# Patient Record
Sex: Female | Born: 1970
Health system: Southern US, Community
[De-identification: ages and names within clinical notes are randomized; demographics above are authoritative.]

## PROBLEM LIST (undated history)

## (undated) DIAGNOSIS — R51 Headache: Secondary | ICD-10-CM

## (undated) DIAGNOSIS — N924 Excessive bleeding in the premenopausal period: Secondary | ICD-10-CM

## (undated) DIAGNOSIS — Z8619 Personal history of other infectious and parasitic diseases: Secondary | ICD-10-CM

## (undated) DIAGNOSIS — D649 Anemia, unspecified: Secondary | ICD-10-CM

## (undated) DIAGNOSIS — R519 Headache, unspecified: Secondary | ICD-10-CM

## (undated) HISTORY — DX: Headache, unspecified: R51.9

## (undated) HISTORY — PX: NO PAST SURGERIES: SHX2092

## (undated) HISTORY — DX: Personal history of other infectious and parasitic diseases: Z86.19

## (undated) HISTORY — DX: Headache: R51

---

## 2004-12-07 ENCOUNTER — Other Ambulatory Visit: Admission: RE | Admit: 2004-12-07 | Discharge: 2004-12-07 | Payer: Self-pay | Admitting: Obstetrics and Gynecology

## 2006-02-19 ENCOUNTER — Other Ambulatory Visit: Admission: RE | Admit: 2006-02-19 | Discharge: 2006-02-19 | Payer: Self-pay | Admitting: Obstetrics and Gynecology

## 2008-11-07 ENCOUNTER — Emergency Department (HOSPITAL_BASED_OUTPATIENT_CLINIC_OR_DEPARTMENT_OTHER): Admission: EM | Admit: 2008-11-07 | Discharge: 2008-11-07 | Payer: Self-pay | Admitting: Emergency Medicine

## 2008-11-07 ENCOUNTER — Ambulatory Visit: Payer: Self-pay | Admitting: Diagnostic Radiology

## 2010-07-26 ENCOUNTER — Encounter
Admission: RE | Admit: 2010-07-26 | Discharge: 2010-10-24 | Payer: Self-pay | Source: Home / Self Care | Attending: Internal Medicine | Admitting: Internal Medicine

## 2011-02-19 LAB — CBC
Platelets: 259 10*3/uL (ref 150–400)
WBC: 9.5 10*3/uL (ref 4.0–10.5)

## 2011-02-19 LAB — COMPREHENSIVE METABOLIC PANEL
AST: 26 U/L (ref 0–37)
Albumin: 5 g/dL (ref 3.5–5.2)
Alkaline Phosphatase: 54 U/L (ref 39–117)
Chloride: 104 mEq/L (ref 96–112)
GFR calc Af Amer: >60 mL/min (ref >60)
Potassium: 3.9 mEq/L (ref 3.5–5.1)
Total Bilirubin: 0.6 mg/dL (ref 0.3–1.2)

## 2011-02-19 LAB — URINALYSIS, ROUTINE W REFLEX MICROSCOPIC
Glucose, UA: NEGATIVE mg/dL
Protein, ur: NEGATIVE mg/dL
Urobilinogen, UA: 0.2 mg/dL (ref 0.0–1.0)

## 2011-02-19 LAB — DIFFERENTIAL
Basophils Absolute: 0.3 10*3/uL — ABNORMAL HIGH (ref 0.0–0.1)
Basophils Relative: 3 % — ABNORMAL HIGH (ref 0–1)
Eosinophils Relative: 2 % (ref 0–5)
Monocytes Absolute: 0.8 10*3/uL (ref 0.1–1.0)

## 2012-10-20 ENCOUNTER — Ambulatory Visit: Payer: BC Managed Care – PPO | Attending: Podiatry | Admitting: Rehabilitation

## 2012-10-20 DIAGNOSIS — M25579 Pain in unspecified ankle and joints of unspecified foot: Secondary | ICD-10-CM | POA: Insufficient documentation

## 2012-10-20 DIAGNOSIS — M25676 Stiffness of unspecified foot, not elsewhere classified: Secondary | ICD-10-CM | POA: Insufficient documentation

## 2012-10-20 DIAGNOSIS — IMO0001 Reserved for inherently not codable concepts without codable children: Secondary | ICD-10-CM | POA: Insufficient documentation

## 2012-10-20 DIAGNOSIS — M25673 Stiffness of unspecified ankle, not elsewhere classified: Secondary | ICD-10-CM | POA: Insufficient documentation

## 2012-10-24 ENCOUNTER — Ambulatory Visit: Payer: BC Managed Care – PPO | Admitting: Rehabilitation

## 2012-10-27 ENCOUNTER — Ambulatory Visit: Payer: BC Managed Care – PPO | Admitting: Rehabilitation

## 2012-11-06 ENCOUNTER — Ambulatory Visit: Payer: BC Managed Care – PPO | Attending: Adult Reconstructive Orthopaedic Surgery | Admitting: Rehabilitation

## 2012-11-06 DIAGNOSIS — IMO0001 Reserved for inherently not codable concepts without codable children: Secondary | ICD-10-CM | POA: Insufficient documentation

## 2012-11-06 DIAGNOSIS — M25673 Stiffness of unspecified ankle, not elsewhere classified: Secondary | ICD-10-CM | POA: Insufficient documentation

## 2012-11-06 DIAGNOSIS — M25579 Pain in unspecified ankle and joints of unspecified foot: Secondary | ICD-10-CM | POA: Insufficient documentation

## 2012-11-06 DIAGNOSIS — M25676 Stiffness of unspecified foot, not elsewhere classified: Secondary | ICD-10-CM | POA: Insufficient documentation

## 2012-11-10 ENCOUNTER — Ambulatory Visit: Payer: BC Managed Care – PPO | Admitting: Rehabilitation

## 2012-11-13 ENCOUNTER — Ambulatory Visit: Payer: BC Managed Care – PPO | Admitting: Rehabilitation

## 2012-11-18 ENCOUNTER — Ambulatory Visit: Payer: BC Managed Care – PPO | Admitting: Rehabilitation

## 2012-11-21 ENCOUNTER — Ambulatory Visit: Payer: BC Managed Care – PPO | Admitting: Rehabilitation

## 2012-11-25 ENCOUNTER — Ambulatory Visit: Payer: BC Managed Care – PPO | Admitting: Rehabilitation

## 2012-12-01 ENCOUNTER — Ambulatory Visit: Payer: BC Managed Care – PPO | Admitting: Rehabilitation

## 2012-12-09 ENCOUNTER — Ambulatory Visit: Payer: BC Managed Care – PPO | Attending: Adult Reconstructive Orthopaedic Surgery | Admitting: Rehabilitation

## 2012-12-09 DIAGNOSIS — M25673 Stiffness of unspecified ankle, not elsewhere classified: Secondary | ICD-10-CM | POA: Insufficient documentation

## 2012-12-09 DIAGNOSIS — M25579 Pain in unspecified ankle and joints of unspecified foot: Secondary | ICD-10-CM | POA: Insufficient documentation

## 2012-12-09 DIAGNOSIS — M25676 Stiffness of unspecified foot, not elsewhere classified: Secondary | ICD-10-CM | POA: Insufficient documentation

## 2012-12-09 DIAGNOSIS — IMO0001 Reserved for inherently not codable concepts without codable children: Secondary | ICD-10-CM | POA: Insufficient documentation

## 2012-12-11 ENCOUNTER — Ambulatory Visit: Payer: BC Managed Care – PPO | Admitting: Rehabilitation

## 2012-12-16 ENCOUNTER — Ambulatory Visit: Payer: BC Managed Care – PPO | Admitting: Rehabilitation

## 2012-12-18 ENCOUNTER — Ambulatory Visit: Payer: BC Managed Care – PPO | Admitting: Rehabilitation

## 2013-01-08 ENCOUNTER — Encounter: Payer: BC Managed Care – PPO | Admitting: Rehabilitation

## 2013-05-25 ENCOUNTER — Encounter (HOSPITAL_BASED_OUTPATIENT_CLINIC_OR_DEPARTMENT_OTHER): Payer: Self-pay

## 2013-05-25 ENCOUNTER — Emergency Department (HOSPITAL_BASED_OUTPATIENT_CLINIC_OR_DEPARTMENT_OTHER)
Admission: EM | Admit: 2013-05-25 | Discharge: 2013-05-25 | Disposition: A | Discharge status: Home / Self Care | Payer: BC Managed Care – PPO | Attending: Emergency Medicine | Admitting: Emergency Medicine

## 2013-05-25 DIAGNOSIS — H109 Unspecified conjunctivitis: Secondary | ICD-10-CM | POA: Insufficient documentation

## 2013-05-25 MED ORDER — TOBRAMYCIN 0.3 % OP SOLN: 1.0000 [drp] | OPHTHALMIC | Status: AC

## 2013-05-25 MED ORDER — FLUORESCEIN SODIUM 1 MG OP STRP
1.0000 | ORAL_STRIP | Freq: Once | OPHTHALMIC | Status: AC
  Administered 2013-05-25: 1 via OPHTHALMIC
  Filled 2013-05-25: qty 1

## 2013-05-25 MED ORDER — TETRACAINE HCL 0.5 % OP SOLN
2.0000 [drp] | Freq: Once | OPHTHALMIC | Status: AC
  Administered 2013-05-25: 2 [drp] via OPHTHALMIC
  Filled 2013-05-25: qty 2

## 2013-05-25 NOTE — ED Notes (Signed)
Redness and drainage to left eye.

## 2013-05-25 NOTE — ED Provider Notes (Signed)
History    CSN: [id number] Arrival date & time 05/25/13  1308  First MD Initiated Contact with Patient 05/25/13 [phone]     Chief Complaint  Patient presents with  . Conjunctivitis   (Consider location/radiation/quality/duration/timing/severity/associated sxs/prior Treatment) HPI Comments: Patient presents with left eye redness and drainage since last night. She started having itching to her eye 2 days ago. This morning there is redness to her eye with clear drainage and matted eyelids. Denies any fevers, chills, nausea or vomiting. Denies any visual change. Denies any headache. Denies anyone else with similar symptoms. No previous eye issues. Does not use contacts or glasses.  Patient is a 42 y.o. female presenting with conjunctivitis. The history is provided by the patient.  Conjunctivitis This is a new problem. The current episode started 2 days ago. The problem occurs constantly. The problem has been gradually worsening. Pertinent negatives include no chest pain, no abdominal pain, no headaches and no shortness of breath. Nothing aggravates the symptoms. Nothing relieves the symptoms. She has tried nothing for the symptoms.   History reviewed. No pertinent past medical history. History reviewed. No pertinent past surgical history. No family history on file. History  Substance Use Topics  . Smoking status: Never Smoker   . Smokeless tobacco: Not on file  . Alcohol Use: No   OB History   Grav Para Term Preterm Abortions TAB SAB Ect Mult Living                 Review of Systems  Constitutional: Negative for activity change.  HENT: Negative for congestion and rhinorrhea.   Eyes: Positive for discharge, redness and itching. Negative for photophobia, pain and visual disturbance.  Respiratory: Negative for cough, chest tightness and shortness of breath.   Cardiovascular: Negative for chest pain.  Gastrointestinal: Negative for nausea, vomiting and abdominal pain.  Genitourinary:  Negative for dysuria, vaginal bleeding and vaginal discharge.  Musculoskeletal: Negative for back pain.  Skin: Negative for rash.  Neurological: Negative for dizziness, weakness and headaches.  A complete 10 system review of systems was obtained and all systems are negative except as noted in the HPI and PMH.    Allergies  Review of patient's allergies indicates no known allergies.  Home Medications   Current Outpatient Rx  Name  Route  Sig  Dispense  Refill  . tobramycin (TOBREX) 0.3 % ophthalmic solution   Left Eye   Place 1 drop into the left eye every 4 (four) hours.   5 mL   0    BP 120/56  Pulse 75  Temp(Src) 98.2 F (36.8 C) (Oral)  Resp 20  Ht 5\' 8"  (1.727 m)  Wt 169 lb (76.658 kg)  BMI 25.7 kg/m2  SpO2 99% Physical Exam  Constitutional: She is oriented to person, place, and time. She appears well-developed and well-nourished. No distress.  HENT:  Head: Normocephalic and atraumatic.  Mouth/Throat: Oropharynx is clear and moist. No oropharyngeal exudate.  Eyes: EOM are normal. Pupils are equal, round, and reactive to light. Left eye exhibits discharge. No foreign body present in the left eye. Left conjunctiva is injected.  Slit lamp exam:      The left eye shows no corneal abrasion and no fluorescein uptake.  Conjunctival injection.  No pain with EOMI, PERRLA. No fluoroscein uptake.  IOP 14  Neck: Normal range of motion. Neck supple.  Cardiovascular: Normal rate, regular rhythm and normal heart sounds.   No murmur heard. Pulmonary/Chest: Effort normal and breath sounds normal.  No respiratory distress.  Abdominal: Soft. There is no tenderness. There is no rebound and no guarding.  Musculoskeletal: Normal range of motion. She exhibits no edema and no tenderness.  Neurological: She is alert and oriented to person, place, and time. No cranial nerve deficit. She exhibits normal muscle tone. Coordination normal.  Skin: Skin is warm.    ED Course  Procedures  (including critical care time) Labs Reviewed - No data to display No results found. 1. Conjunctivitis     MDM  L eye redness and drainage.  No fever or visual change.  No photophobia. No pain with EOMI.  No abrasion seen. IOP normal.  Will treat for conjunctivitis.   Glynn Octave, MD 05/25/13 [phone]

## 2015-03-19 ENCOUNTER — Encounter (HOSPITAL_BASED_OUTPATIENT_CLINIC_OR_DEPARTMENT_OTHER): Payer: Self-pay

## 2015-03-19 ENCOUNTER — Emergency Department (HOSPITAL_BASED_OUTPATIENT_CLINIC_OR_DEPARTMENT_OTHER): Payer: Managed Care, Other (non HMO)

## 2015-03-19 ENCOUNTER — Emergency Department (HOSPITAL_BASED_OUTPATIENT_CLINIC_OR_DEPARTMENT_OTHER)
Admission: EM | Admit: 2015-03-19 | Discharge: 2015-03-19 | Disposition: A | Discharge status: Home / Self Care | Payer: Managed Care, Other (non HMO) | Attending: Emergency Medicine | Admitting: Emergency Medicine

## 2015-03-19 DIAGNOSIS — Y998 Other external cause status: Secondary | ICD-10-CM | POA: Insufficient documentation

## 2015-03-19 DIAGNOSIS — S80811A Abrasion, right lower leg, initial encounter: Secondary | ICD-10-CM | POA: Insufficient documentation

## 2015-03-19 DIAGNOSIS — Y9389 Activity, other specified: Secondary | ICD-10-CM | POA: Insufficient documentation

## 2015-03-19 DIAGNOSIS — S86911A Strain of unspecified muscle(s) and tendon(s) at lower leg level, right leg, initial encounter: Secondary | ICD-10-CM | POA: Diagnosis not present

## 2015-03-19 DIAGNOSIS — W1842XA Slipping, tripping and stumbling without falling due to stepping into hole or opening, initial encounter: Secondary | ICD-10-CM | POA: Insufficient documentation

## 2015-03-19 DIAGNOSIS — Y9289 Other specified places as the place of occurrence of the external cause: Secondary | ICD-10-CM | POA: Insufficient documentation

## 2015-03-19 DIAGNOSIS — S8391XA Sprain of unspecified site of right knee, initial encounter: Secondary | ICD-10-CM | POA: Insufficient documentation

## 2015-03-19 DIAGNOSIS — S8991XA Unspecified injury of right lower leg, initial encounter: Secondary | ICD-10-CM | POA: Diagnosis present

## 2015-03-19 NOTE — ED Notes (Signed)
Patient here with right knee pain this evening after stepping in hole. Abrasion noted to right lower leg as well.

## 2015-03-19 NOTE — Discharge Instructions (Signed)
Use crutches and ace wrap for comfort.   You may bear weight on the knee.   Follow up with your doctor.   Take tylenol, motrin for pain.   Return to ER if you have severe pain, unable to walk.

## 2015-03-19 NOTE — ED Provider Notes (Signed)
CSN: [id number]     Arrival date & time 03/19/15  1837 History  This chart was scribed for Wandra Arthurs, MD by Chester Holstein, ED Scribe. This patient was seen in room MH10/MH10 and the patient's care was started at 7:46 PM.    Chief Complaint  Patient presents with  . Knee Injury     The history is provided by the patient. No language interpreter was used.   HPI Comments: Ann Owen is a 44 y.o. female who presents to the Emergency Department complaining of right knee injury. Pt states she stepped into the hole and twisted to her left. Pt was working for [employer] and stepped into an 18 in hole at onset.  Pt was able to ambulate and continue to build the deck after incident. Noted swelling to knee and abrasions to shin.  Pt denies weakness, numbness, head injury and LOC.   History reviewed. No pertinent past medical history. History reviewed. No pertinent past surgical history. No family history on file. History  Substance Use Topics  . Smoking status: Never Smoker   . Smokeless tobacco: Not on file  . Alcohol Use: No   OB History    No data available     Review of Systems  Musculoskeletal: Positive for myalgias, joint swelling and arthralgias. Negative for gait problem.  Skin: Positive for wound.  Neurological: Negative for syncope, weakness, numbness and headaches.  All other systems reviewed and are negative.     Allergies  Review of patient's allergies indicates no known allergies.  Home Medications   Prior to Admission medications   Not on File   BP 118/64 mmHg  Pulse 72  Temp(Src) 98.4 F (36.9 C) (Oral)  Resp 18  Ht 5\' 8"  (1.727 m)  Wt 174 lb (78.926 kg)  BMI 26.46 kg/m2  SpO2 100%  LMP 03/19/2015 Physical Exam  Constitutional: She is oriented to person, place, and time. She appears well-developed and well-nourished.  HENT:  Head: Normocephalic.  Eyes: Conjunctivae are normal.  Neck: Normal range of motion. Neck supple.  Pulmonary/Chest:  Effort normal.  Musculoskeletal: Normal range of motion.  Swelling around right knee Mild pain on the patella; patella appears stable Abrasion on right shin with no tenderness  Neurological: She is alert and oriented to person, place, and time.  Skin: Skin is warm and dry.  Psychiatric: She has a normal mood and affect. Her behavior is normal.  Nursing note and vitals reviewed.   ED Course  Procedures (including critical care time) DIAGNOSTIC STUDIES: Oxygen Saturation is 100% on room air, normal by my interpretation.    COORDINATION OF CARE: 7:50 PM Discussed treatment plan with patient at beside, the patient agrees with the plan and has no further questions at this time.   Labs Review Labs Reviewed - No data to display  Imaging Review Dg Knee Complete 4 Views Right  03/19/2015   CLINICAL DATA:  Acute onset of anterior right knee pain after stepping in hole, with lateral pain on movement of the foot. Initial encounter.  EXAM: RIGHT KNEE - COMPLETE 4+ VIEW  COMPARISON:  None.  FINDINGS: There is no evidence of fracture or dislocation. The joint spaces are preserved. No significant degenerative change is seen; the patellofemoral joint is grossly unremarkable in appearance.  No significant joint effusion is seen. The visualized soft tissues are normal in appearance.  IMPRESSION: No evidence of fracture or dislocation.   Electronically Signed   By: Francoise Schaumann.D.  On: 03/19/2015 19:22     EKG Interpretation None      MDM   Final diagnoses:  None   Ann Owen is a 44 y.o. female here with R knee injury. Xray showed no fracture. Neurovascular intact. Likely sprain. Ace wrap applied, given crutches. Will dc home.    I personally performed the services described in this documentation, which was scribed in my presence. The recorded information has been reviewed and is accurate.    Wandra Arthurs, MD 03/19/15 [phone]

## 2016-01-03 MED FILL — AMOXICILLIN 500 MG CAPSULE: 500 | 6 days supply | Qty: 28 | Fill #0

## 2016-01-09 MED FILL — AMOXICILLIN 500 MG CAPSULE: 500 | 6 days supply | Qty: 28 | Fill #1

## 2016-06-29 ENCOUNTER — Telehealth: Payer: Self-pay | Admitting: *Deleted

## 2016-06-29 NOTE — Telephone Encounter (Signed)
Patient returning your call.

## 2016-06-29 NOTE — Telephone Encounter (Signed)
Unable to reach patient at time of Pre-Visit Call.  Left message for patient to return call when available.    

## 2016-07-02 ENCOUNTER — Encounter: Payer: Self-pay | Admitting: Family Medicine

## 2016-07-02 ENCOUNTER — Ambulatory Visit (INDEPENDENT_AMBULATORY_CARE_PROVIDER_SITE_OTHER): Payer: 59 | Admitting: Family Medicine

## 2016-07-02 VITALS — BP 100/62 | HR 69 | Temp 98.4°F | Ht 67.5 in | Wt 177.6 lb

## 2016-07-02 DIAGNOSIS — Z114 Encounter for screening for human immunodeficiency virus [HIV]: Secondary | ICD-10-CM

## 2016-07-02 DIAGNOSIS — Z13220 Encounter for screening for lipoid disorders: Secondary | ICD-10-CM | POA: Diagnosis not present

## 2016-07-02 DIAGNOSIS — G5603 Carpal tunnel syndrome, bilateral upper limbs: Secondary | ICD-10-CM | POA: Diagnosis not present

## 2016-07-02 DIAGNOSIS — M7711 Lateral epicondylitis, right elbow: Secondary | ICD-10-CM

## 2016-07-02 DIAGNOSIS — Z1239 Encounter for other screening for malignant neoplasm of breast: Secondary | ICD-10-CM

## 2016-07-02 LAB — LIPID PANEL
CHOL/HDL RATIO: 2
Cholesterol: 173 mg/dL (ref 0–200)
HDL: 76 mg/dL (ref >39.00)
LDL Cholesterol: 83 mg/dL (ref 0–99)
NONHDL: 97.07
Triglycerides: 70 mg/dL (ref 0.0–149.0)
VLDL: 14 mg/dL (ref 0.0–40.0)

## 2016-07-02 LAB — COMPLETE METABOLIC PANEL WITH GFR
ALBUMIN: 4.2 g/dL (ref 3.6–5.1)
ALK PHOS: 50 U/L (ref 33–115)
ALT: 8 U/L (ref 6–29)
AST: 11 U/L (ref 10–35)
BILIRUBIN TOTAL: 0.3 mg/dL (ref 0.2–1.2)
BUN: 10 mg/dL (ref 7–25)
CALCIUM: 9.2 mg/dL (ref 8.6–10.2)
CO2: 24 mmol/L (ref 20–31)
CREATININE: 0.79 mg/dL (ref 0.50–1.10)
Chloride: 105 mmol/L (ref 98–110)
GFR, Est African American: >89 mL/min (ref >=60)
GFR, Est Non African American: 89 mL/min (ref >=60)
GLUCOSE: 84 mg/dL (ref 65–99)
POTASSIUM: 4.5 mmol/L (ref 3.5–5.3)
SODIUM: 136 mmol/L (ref 135–146)
TOTAL PROTEIN: 6.9 g/dL (ref 6.1–8.1)

## 2016-07-02 LAB — HIV ANTIBODY (ROUTINE TESTING W REFLEX): HIV: NONREACTIVE

## 2016-07-02 NOTE — Patient Instructions (Addendum)
Tennis elbow-wrist strap; Band-It is recommended by some sports docs  Start wearing your wrist splints again. Lateral Epicondylitis With Rehab Lateral epicondylitis involves inflammation and pain around the outer portion of the elbow. The pain is caused by inflammation of the tendons in the forearm that bring back (extend) the wrist. Lateral epicondylitis is also called tennis elbow, because it is very common in tennis players. However, it may occur in any individual who extends the wrist repetitively. If lateral epicondylitis is left untreated, it may become a chronic problem. SYMPTOMS   Pain, tenderness, and inflammation on the outer (lateral) side of the elbow.  Pain or weakness with gripping activities.  Pain that increases with wrist-twisting motions (playing tennis, using a screwdriver, opening a door or a jar).  Pain with lifting objects, including a coffee cup. CAUSES  Lateral epicondylitis is caused by inflammation of the tendons that extend the wrist. Causes of injury may include:  Repetitive stress and strain on the muscles and tendons that extend the wrist.  Sudden change in activity level or intensity.  Incorrect grip in racquet sports.  Incorrect grip size of racquet (often too large).  Incorrect hitting position or technique (usually backhand, leading with the elbow).  Using a racket that is too heavy. RISK INCREASES WITH:  Sports or occupations that require repetitive and/or strenuous forearm and wrist movements (tennis, squash, racquetball, carpentry).  Poor wrist and forearm strength and flexibility.  Failure to warm up properly before activity.  Resuming activity before healing, rehabilitation, and conditioning are complete. PREVENTION   Warm up and stretch properly before activity.  Maintain physical fitness:  Strength, flexibility, and endurance.  Cardiovascular fitness.  Wear and use properly fitted equipment.  Learn and use proper technique and  have a coach correct improper technique.  Wear a tennis elbow (counterforce) brace. PROGNOSIS  The course of this condition depends on the degree of the injury. If treated properly, acute cases (symptoms lasting less than 4 weeks) are often resolved in 2 to 6 weeks. Chronic (longer lasting cases) often resolve in 3 to 6 months but may require physical therapy. RELATED COMPLICATIONS   Frequently recurring symptoms, resulting in a chronic problem. Properly treating the problem the first time decreases frequency of recurrence.  Chronic inflammation, scarring tendon degeneration, and partial tendon tear, requiring surgery.  Delayed healing or resolution of symptoms. TREATMENT  Treatment first involves the use of ice and medicine to reduce pain and inflammation. Strengthening and stretching exercises may help reduce discomfort if performed regularly. These exercises may be performed at home if the condition is an acute injury. Chronic cases may require a referral to a physical therapist for evaluation and treatment. Your caregiver may advise a corticosteroid injection to help reduce inflammation. Rarely, surgery is needed. MEDICATION  If pain medicine is needed, nonsteroidal anti-inflammatory medicines (aspirin and ibuprofen), or other minor pain relievers (acetaminophen), are often advised.  Do not take pain medicine for 7 days before surgery.  Prescription pain relievers may be given, if your caregiver thinks they are needed. Use only as directed and only as much as you need.  Corticosteroid injections may be recommended. These injections should be reserved only for the most severe cases, because they can only be given a certain number of times. HEAT AND COLD  Cold treatment (icing) should be applied for 10 to 15 minutes every 2 to 3 hours for inflammation and pain, and immediately after activity that aggravates your symptoms. Use ice packs or an ice massage.  Heat treatment may be used  before performing stretching and strengthening activities prescribed by your caregiver, physical therapist, or athletic trainer. Use a heat pack or a warm water soak. SEEK MEDICAL CARE IF: Symptoms get worse or do not improve in 2 weeks, despite treatment. EXERCISES  RANGE OF MOTION (ROM) AND STRETCHING EXERCISES - Epicondylitis, Lateral (Tennis Elbow) These exercises may help you when beginning to rehabilitate your injury. Your symptoms may go away with or without further involvement from your physician, physical therapist, or athletic trainer. While completing these exercises, remember:   Restoring tissue flexibility helps normal motion to return to the joints. This allows healthier, less painful movement and activity.  An effective stretch should be held for at least 30 seconds.  A stretch should never be painful. You should only feel a gentle lengthening or release in the stretched tissue. RANGE OF MOTION - Wrist Flexion, Active-Assisted  Extend your right / left elbow with your fingers pointing down.*  Gently pull the back of your hand towards you, until you feel a gentle stretch on the top of your forearm.  Hold this position for __________ seconds. Repeat __________ times. Complete this exercise __________ times per day.  *If directed by your physician, physical therapist or athletic trainer, complete this stretch with your elbow bent, rather than extended. RANGE OF MOTION - Wrist Extension, Active-Assisted  Extend your right / left elbow and turn your palm upwards.*  Gently pull your palm and fingertips back, so your wrist extends and your fingers point more toward the ground.  You should feel a gentle stretch on the inside of your forearm.  Hold this position for __________ seconds. Repeat __________ times. Complete this exercise __________ times per day. *If directed by your physician, physical therapist or athletic trainer, complete this stretch with your elbow bent, rather  than extended. STRETCH - Wrist Flexion  Place the back of your right / left hand on a tabletop, leaving your elbow slightly bent. Your fingers should point away from your body.  Gently press the back of your hand down onto the table by straightening your elbow. You should feel a stretch on the top of your forearm.  Hold this position for __________ seconds. Repeat __________ times. Complete this stretch __________ times per day.  STRETCH - Wrist Extension   Place your right / left fingertips on a tabletop, leaving your elbow slightly bent. Your fingers should point backwards.  Gently press your fingers and palm down onto the table by straightening your elbow. You should feel a stretch on the inside of your forearm.  Hold this position for __________ seconds. Repeat __________ times. Complete this stretch __________ times per day.  STRENGTHENING EXERCISES - Epicondylitis, Lateral (Tennis Elbow) These exercises may help you when beginning to rehabilitate your injury. They may resolve your symptoms with or without further involvement from your physician, physical therapist, or athletic trainer. While completing these exercises, remember:   Muscles can gain both the endurance and the strength needed for everyday activities through controlled exercises.  Complete these exercises as instructed by your physician, physical therapist or athletic trainer. Increase the resistance and repetitions only as guided.  You may experience muscle soreness or fatigue, but the pain or discomfort you are trying to eliminate should never worsen during these exercises. If this pain does get worse, stop and make sure you are following the directions exactly. If the pain is still present after adjustments, discontinue the exercise until you can discuss the trouble with your  caregiver. STRENGTH - Wrist Flexors  Sit with your right / left forearm palm-up and fully supported on a table or countertop. Your elbow should  be resting below the height of your shoulder. Allow your wrist to extend over the edge of the surface.  Loosely holding a __________ weight, or a piece of rubber exercise band or tubing, slowly curl your hand up toward your forearm.  Hold this position for __________ seconds. Slowly lower the wrist back to the starting position in a controlled manner. Repeat __________ times. Complete this exercise __________ times per day.  STRENGTH - Wrist Extensors  Sit with your right / left forearm palm-down and fully supported on a table or countertop. Your elbow should be resting below the height of your shoulder. Allow your wrist to extend over the edge of the surface.  Loosely holding a __________ weight, or a piece of rubber exercise band or tubing, slowly curl your hand up toward your forearm.  Hold this position for __________ seconds. Slowly lower the wrist back to the starting position in a controlled manner. Repeat __________ times. Complete this exercise __________ times per day.  STRENGTH - Ulnar Deviators  Stand with a ____________________ weight in your right / left hand, or sit while holding a rubber exercise band or tubing, with your healthy arm supported on a table or countertop.  Move your wrist, so that your pinkie travels toward your forearm and your thumb moves away from your forearm.  Hold this position for __________ seconds and then slowly lower the wrist back to the starting position. Repeat __________ times. Complete this exercise __________ times per day STRENGTH - Radial Deviators  Stand with a ____________________ weight in your right / left hand, or sit while holding a rubber exercise band or tubing, with your injured arm supported on a table or countertop.  Raise your hand upward in front of you or pull up on the rubber tubing.  Hold this position for __________ seconds and then slowly lower the wrist back to the starting position. Repeat __________ times. Complete  this exercise __________ times per day. STRENGTH - Forearm Supinators   Sit with your right / left forearm supported on a table, keeping your elbow below shoulder height. Rest your hand over the edge, palm down.  Gently grip a hammer or a soup ladle.  Without moving your elbow, slowly turn your palm and hand upward to a "thumbs-up" position.  Hold this position for __________ seconds. Slowly return to the starting position. Repeat __________ times. Complete this exercise __________ times per day.  STRENGTH - Forearm Pronators   Sit with your right / left forearm supported on a table, keeping your elbow below shoulder height. Rest your hand over the edge, palm up.  Gently grip a hammer or a soup ladle.  Without moving your elbow, slowly turn your palm and hand upward to a "thumbs-up" position.  Hold this position for __________ seconds. Slowly return to the starting position. Repeat __________ times. Complete this exercise __________ times per day.  STRENGTH - Grip  Grasp a tennis ball, a dense sponge, or a large, rolled sock in your hand.  Squeeze as hard as you can, without increasing any pain.  Hold this position for __________ seconds. Release your grip slowly. Repeat __________ times. Complete this exercise __________ times per day.  STRENGTH - Elbow Extensors, Isometric  Stand or sit upright, on a firm surface. Place your right / left arm so that your palm faces your stomach,  and it is at the height of your waist.  Place your opposite hand on the underside of your forearm. Gently push up as your right / left arm resists. Push as hard as you can with both arms, without causing any pain or movement at your right / left elbow. Hold this stationary position for __________ seconds. Gradually release the tension in both arms. Allow your muscles to relax completely before repeating.   This information is not intended to replace advice given to you by your health care provider. Make  sure you discuss any questions you have with your health care provider.   Document Released: 10/22/2005 Document Revised: 11/12/2014 Document Reviewed: 02/03/2009 [employer]vier Interactive Patient Education [employer].

## 2016-07-02 NOTE — Progress Notes (Signed)
Chief Complaint  Patient presents with  . Establish Care    pt having pain in (B) wrist and (R) elbow along with tingling-x 1 year with the wrist and x 3 mos with the elbow       New Patient Visit SUBJECTIVE: HPI: Ann Owen is an 45 y.o.female who is being seen for establishing care.  The patient was previously seen at [place], but moved to [place] and lives much closer to [place].  Health maintenance history: Pap/pelvic: 2015- HPV and pap both neg  Mammogram: 2015 Routine blood work: ~3 years HIV screening: Unsure, doesn't believe she has had done Tetanus: Unsure; will check date, but has been within 10 years  Concerns:  Wrist issue B/l wrist pain/numbness/tingling for 7-8 months, R worse than L. She has a hx of CTS that resolved with the use of wrist splints. She still has the splints, but has not been using them. Pt admits she has been doing more typing with her job. Symptoms are worse in the evening and at night.  Elbow pain R elbow started bothering her around 3 mo ago. The outside of her elbow hurts. She thinks it is just tennis elbow, but was told by a co-worker that a family member required surgery. She would like it checked out. She attributes her typing to the root cause of this. No numbness or tingling on the elbow.  No Known Allergies  Past Medical History:  Diagnosis Date  . Frequent headaches   . History of chicken pox    Past Surgical History:  Procedure Laterality Date  . NO PAST SURGERIES     Social History   Social History  . Marital status: Married   Social History Main Topics  . Smoking status: Never Smoker  . Smokeless tobacco: Never Used  . Alcohol use No  . Drug use: No   Family History  Problem Relation Age of Onset  . Hyperlipidemia Mother   . Arthritis Father   . Cancer Maternal Grandmother     Bone   Takes no medications routinely  Patient's last menstrual period was 06/18/2016.  ROS Neuro: +Numbness and tingling in hands  MSK: +R  elbow and b/l wrist pain   OBJECTIVE: BP 100/62 (BP Location: Left Arm, Patient Position: Sitting, Cuff Size: Normal)   Pulse 69   Temp 98.4 F (36.9 C) (Oral)   Ht 5' 7.5" (1.715 m)   Wt 177 lb 9.6 oz (80.6 kg)   LMP 06/18/2016   SpO2 99%   BMI 27.41 kg/m   Constitutional: -  VS reviewed -  Well developed, well nourished, appears stated age -  No apparent distress  Psychiatric: -  Oriented to person, place, and time -  Memory intact -  Affect and mood normal -  Fluent conversation, good eye contact -  Judgment and insight age appropriate  Eye: -  Conjunctivae clear, no discharge -  Pupils symmetric, round, reactive to light  Cardiovascular: -  RRR, no murmurs -  No LE edema -  Brisk cap refill of UE digits  Respiratory: -  Normal respiratory effort, no accessory muscle use, no retraction -  Breath sounds equal, no wheezes, no ronchi, no crackles  Gastrointestinal: -  Bowel sounds normal -  No tenderness, no distention, no guarding, no masses  Neurological:  -  CN II - XII grossly intact -  Neg Tinel's and Phalen's -  Sensation grossly intact to light touch, equal bilaterally  Musculoskeletal: -  Grip strength  adequate -  No thenar eminence atrophy or asymmetry -  +TTP over the lateral epicondyle on R -  +pain with resisted R wrist extension; strength is 5/5 throughout -  No swelling or ecchymosis appreciate -  No deformity  Skin: -  No significant lesion on inspection -  Warm and dry to palpation   ASSESSMENT/PLAN: Bilateral carpal tunnel syndrome  Tennis elbow syndrome, right  Screening for HIV (human immunodeficiency virus) - Plan: HIV antibody  Screening cholesterol level - Plan: COMPLETE METABOLIC PANEL WITH GFR, Lipid panel  Screening breast examination - Plan: MM DIGITAL SCREENING BILATERAL  Patient instructed to sign release of records form for her previous PCP. Start wearing splints again. Made recommendation for tennis elbow strap. Motrin/Tylenol prn.  Ice prn. Stretches/exercises given for tennis elbow rehab. Avoid activities that aggravate symptoms when/if able. If symptoms worse, will recommend PT for elbow. Will consider Hand referral vs EMG. Patient should return prn or pending an abnormal lab result. The patient voiced understanding and agreement to the plan.   Crosby Oyster Despard

## 2016-07-02 NOTE — Progress Notes (Signed)
Pre visit review using our clinic review tool, if applicable. No additional management support is needed unless otherwise documented below in the visit note. 

## 2016-07-04 ENCOUNTER — Encounter: Payer: Self-pay | Admitting: *Deleted

## 2016-08-27 ENCOUNTER — Ambulatory Visit (HOSPITAL_BASED_OUTPATIENT_CLINIC_OR_DEPARTMENT_OTHER)
Admission: RE | Admit: 2016-08-27 | Discharge: 2016-08-27 | Disposition: A | Discharge status: Home / Self Care | Payer: 59 | Source: Ambulatory Visit | Attending: Family Medicine | Admitting: Family Medicine

## 2016-08-27 DIAGNOSIS — Z1239 Encounter for other screening for malignant neoplasm of breast: Secondary | ICD-10-CM

## 2016-08-27 DIAGNOSIS — Z1231 Encounter for screening mammogram for malignant neoplasm of breast: Secondary | ICD-10-CM | POA: Diagnosis not present

## 2016-10-09 ENCOUNTER — Ambulatory Visit (INDEPENDENT_AMBULATORY_CARE_PROVIDER_SITE_OTHER): Payer: 59 | Admitting: Podiatry

## 2016-10-09 ENCOUNTER — Encounter: Payer: Self-pay | Admitting: Podiatry

## 2016-10-09 VITALS — BP 110/57 | HR 75 | Ht 67.5 in | Wt 174.0 lb

## 2016-10-09 DIAGNOSIS — M216X9 Other acquired deformities of unspecified foot: Secondary | ICD-10-CM

## 2016-10-09 DIAGNOSIS — M7740 Metatarsalgia, unspecified foot: Secondary | ICD-10-CM

## 2016-10-09 DIAGNOSIS — M21969 Unspecified acquired deformity of unspecified lower leg: Secondary | ICD-10-CM | POA: Diagnosis not present

## 2016-10-09 NOTE — Progress Notes (Signed)
SUBJECTIVE: 45 y.o. year old female presents complaining of back of heel pain that radiates to bottom of the heel and pain at the 5th toe area of right foot. Left foot hurts under the 3rd and 4th digits that radiates to the tip of toes. Duration of 4 months. Had done stretch exercise for Achilles area and heating pads that did not help. On feet about 11 hours a day 7 off and 7 on.  REVIEW OF SYSTEMS: Pertinent items noted in HPI and remainder of comprehensive ROS otherwise negative.  OBJECTIVE: DERMATOLOGIC EXAMINATION: No abnormal findings.  VASCULAR EXAMINATION OF LOWER LIMBS: All pedal pulses are palpable with normal pulsation.  Capillary Filling times within 3 seconds in all digits.  No edema or erythema noted. Temperature gradient from tibial crest to dorsum of foot is within normal bilateral.  NEUROLOGIC EXAMINATION OF THE LOWER LIMBS: All epicritic and tactile sensations grossly intact.   MUSCULOSKELETAL EXAMINATION: Positive for tight Achilles tendon bilateral. Excess STJ pronation with load of foot bilateral. Elevated first metatarsal bilateral. Bunion deformity left. Pain at right heel, lesser MPJ bilateral, and digits, 3rd and 4th right.  Previous x-rays done in 2014 revealed abnormal elevated first metatarsal bones bilateral.  ASSESSMENT: Equinus bilateral ankle. Plantar fasciitis right. Metatarsalgia bilateral. Rule out Morton's neuroma right. Elevated first metatarsal bilateral. Excess STJ pronation bilateral. Bunion left.  PLAN: Reviewed clinical findings and available treatment options. Both feet casted for Orthotics. Metatarsal binder dispensed medium 1 pr.

## 2016-10-09 NOTE — Patient Instructions (Signed)
Seen for pain in right heel and lesser digits bilateral. Noted of weak first metatarsal bone with lateral weigh shifting. Mild tightness of Achilles tendon present. Both feet casted for orthotics. Metatarsal binder dispensed x 1 pr medium. Will call when they are ready.

## 2016-12-18 ENCOUNTER — Ambulatory Visit (INDEPENDENT_AMBULATORY_CARE_PROVIDER_SITE_OTHER): Payer: 59 | Admitting: Podiatry

## 2016-12-18 DIAGNOSIS — M7740 Metatarsalgia, unspecified foot: Secondary | ICD-10-CM

## 2016-12-18 NOTE — Patient Instructions (Addendum)
Orthotics are doing well. No new complaints.

## 2016-12-18 NOTE — Progress Notes (Signed)
Orthotic check up. They makes her feet feel better.  At the end of the day gets tingling on bottom of the foot but not as bad as before.  Doing well with orthotics. Continue with current level of care.             .                                                                                                                                                                                                                                                       .                           +6.++No flowsheet data found.  .+++++++++++++++++++++++++++++++++++++++++++++++++++++++++++++++++++++++++++.......................................................+++++++++++++++++++++++++++++++++++++++++++++++++++++++++++++++++++++++++++++++++++++++++++++++++++++++++++++++++++++++++++++++++++++++++++++++++++++++++++++++++++++++++++++++++++++++++++++++++++++++++++++++++++++++++++++++++++++++++++++++++++++++++++++++++                                                                                                                                                                                                                                                                                        +  6                      

## 2016-12-19 ENCOUNTER — Encounter: Payer: Self-pay | Admitting: Podiatry

## 2017-01-17 ENCOUNTER — Telehealth: Payer: 59 | Admitting: Family

## 2017-01-17 DIAGNOSIS — B9689 Other specified bacterial agents as the cause of diseases classified elsewhere: Secondary | ICD-10-CM

## 2017-01-17 DIAGNOSIS — J028 Acute pharyngitis due to other specified organisms: Secondary | ICD-10-CM | POA: Diagnosis not present

## 2017-01-17 MED ORDER — AZITHROMYCIN 250 MG PO TABS: ORAL_TABLET | ORAL | 0 refills | Status: DC

## 2017-01-17 MED ORDER — BENZONATATE 100 MG PO CAPS: 100.0000 mg | ORAL_CAPSULE | Freq: Three times a day (TID) | ORAL | 0 refills | Status: DC | PRN

## 2017-01-17 MED FILL — BENZONATATE 100 MG CAP: 100 | 5 days supply | Qty: 30 | Fill #0

## 2017-01-17 MED FILL — AZITHROMYCIN 250 MG TABLET: 250 | 5 days supply | Qty: 6 | Fill #0

## 2017-01-17 NOTE — Progress Notes (Signed)

## 2018-03-18 DIAGNOSIS — Z0100 Encounter for examination of eyes and vision without abnormal findings: Secondary | ICD-10-CM | POA: Diagnosis not present

## 2018-07-13 ENCOUNTER — Emergency Department (INDEPENDENT_AMBULATORY_CARE_PROVIDER_SITE_OTHER): Payer: 59

## 2018-07-13 ENCOUNTER — Emergency Department (INDEPENDENT_AMBULATORY_CARE_PROVIDER_SITE_OTHER)
Admission: EM | Admit: 2018-07-13 | Discharge: 2018-07-13 | Disposition: A | Discharge status: Home / Self Care | Payer: 59 | Source: Home / Self Care | Attending: Family Medicine | Admitting: Family Medicine

## 2018-07-13 ENCOUNTER — Encounter: Payer: Self-pay | Admitting: Emergency Medicine

## 2018-07-13 ENCOUNTER — Other Ambulatory Visit: Payer: Self-pay

## 2018-07-13 DIAGNOSIS — M7989 Other specified soft tissue disorders: Secondary | ICD-10-CM | POA: Diagnosis not present

## 2018-07-13 DIAGNOSIS — S93402A Sprain of unspecified ligament of left ankle, initial encounter: Secondary | ICD-10-CM | POA: Diagnosis not present

## 2018-07-13 DIAGNOSIS — M25572 Pain in left ankle and joints of left foot: Secondary | ICD-10-CM | POA: Diagnosis not present

## 2018-07-13 DIAGNOSIS — M79672 Pain in left foot: Secondary | ICD-10-CM

## 2018-07-13 NOTE — ED Triage Notes (Signed)
Pt c/o of left ankle. States she was doing an obstacle course yesterday when she stepped wrong and twisted her ankle.

## 2018-07-13 NOTE — Discharge Instructions (Signed)
°  You may take 500mg  acetaminophen every 4-6 hours or in combination with ibuprofen 400-600mg  every 6-8 hours as needed for pain and inflammation.  Please follow up with Sports Medicine in 1-2 weeks if not improving.

## 2018-07-13 NOTE — ED Provider Notes (Signed)
[facility]    CSN: [id number] Arrival date & time: 07/13/18  1345     History   Chief Complaint Chief Complaint  Patient presents with  . Ankle Pain    Left    HPI Ann Owen is a 47 y.o. female.   HPI Ann Owen is a 47 y.o. female presenting to UC with c/o Left ankle pain with swelling and bruising to ankle and foot after rolling her foot during a 5K yesterday. Pt was walking from one obstacle to another when she rolled her foot in a rut in the ground. She applied an ace wrap, ice, and elevated it. Pain is 5/10, aching, worse with weightbearing. She has not needed to take any OTC pain medication today. No prior injury to same ankle/foot. No other injuries.    Past Medical History:  Diagnosis Date  . Frequent headaches   . History of chicken pox     There are no active problems to display for this patient.   Past Surgical History:  Procedure Laterality Date  . NO PAST SURGERIES      OB History   None      Home Medications    Prior to Admission medications   Medication Sig Start Date End Date Taking? Authorizing Provider  azithromycin (ZITHROMAX) 250 MG tablet Take 2 tabs now then 1 daily times 4 days 01/17/17   Withrow, Elyse Jarvis, FNP  benzonatate (TESSALON PERLES) 100 MG capsule Take 1-2 capsules (100-200 mg total) by mouth every 8 (eight) hours as needed for cough. 01/17/17   Withrow, Elyse Jarvis, FNP    Family History Family History  Problem Relation Age of Onset  . Hyperlipidemia Mother   . Arthritis Father   . Cancer Maternal Grandmother        Bone    Social History Social History   Tobacco Use  . Smoking status: Never Smoker  . Smokeless tobacco: Never Used  Substance Use Topics  . Alcohol use: No  . Drug use: No     Allergies   Patient has no known allergies.   Review of Systems Review of Systems  Musculoskeletal: Positive for arthralgias, joint swelling and myalgias.  Skin: Positive for color change. Negative for wound.    Neurological: Negative for weakness and numbness.     Physical Exam Triage Vital Signs ED Triage Vitals  Enc Vitals Group     BP 07/13/18 1404 108/71     Pulse Rate 07/13/18 1404 88     Resp --      Temp 07/13/18 1404 98.2 F (36.8 C)     Temp Source 07/13/18 1404 Oral     SpO2 07/13/18 1404 99 %     Weight 07/13/18 1407 174 lb (78.9 kg)     Height 07/13/18 1407 5\' 7"  (1.702 m)     Head Circumference --      Peak Flow --      Pain Score 07/13/18 1407 5     Pain Loc --      Pain Edu? --      Excl. in [place]? --    No data found.  Updated Vital Signs BP 108/71 (BP Location: Right Arm)   Pulse 88   Temp 98.2 F (36.8 C) (Oral)   Ht 5\' 7"  (1.702 m)   Wt 174 lb (78.9 kg)   LMP 06/22/2018 (Exact Date)   SpO2 99%   BMI 27.25 kg/m   Visual Acuity Right Eye Distance:  Left Eye Distance:   Bilateral Distance:    Right Eye Near:   Left Eye Near:    Bilateral Near:     Physical Exam  Constitutional: She is oriented to person, place, and time. She appears well-developed and well-nourished.  HENT:  Head: Normocephalic and atraumatic.  Eyes: EOM are normal.  Neck: Normal range of motion.  Cardiovascular: Normal rate.  Pulses:      Dorsalis pedis pulses are 2+ on the left side.       Posterior tibial pulses are 2+ on the left side.  Pulmonary/Chest: Effort normal.  Musculoskeletal: Normal range of motion. She exhibits edema and tenderness.  Left ankle and foot: mild edema. Tenderness to lateral aspect of ankle and proximal lateral aspect of foot. Full ROM.  Calf is soft, non-tender.  Neurological: She is alert and oriented to person, place, and time.  Skin: Skin is warm and dry. Capillary refill takes less than 2 seconds.  Left ankle and foot: skin in tact. Ecchymosis to lateral and dorsal aspect.   Psychiatric: She has a normal mood and affect. Her behavior is normal.  Nursing note and vitals reviewed.    UC Treatments / Results  Labs (all labs ordered are  listed, but only abnormal results are displayed) Labs Reviewed - No data to display  EKG None  Radiology Dg Ankle Complete Left  Result Date: 07/13/2018 CLINICAL DATA:  Left ankle and foot pain. EXAM: LEFT ANKLE COMPLETE - 3+ VIEW COMPARISON:  None. FINDINGS: There is no evidence of fracture, dislocation, or joint effusion. There is no evidence of arthropathy or other focal bone abnormality. Diffuse soft tissue swelling. IMPRESSION: Diffuse soft tissue swelling. No evidence of fracture or dislocation of the left ankle. Electronically Signed   By: Fidela Salisbury M.D.   On: 07/13/2018 14:38   Dg Foot Complete Left  Result Date: 07/13/2018 CLINICAL DATA:  Foot pain. EXAM: LEFT FOOT - COMPLETE 3+ VIEW COMPARISON:  None. FINDINGS: There is no evidence of fracture or dislocation. There is no evidence of arthropathy or other focal bone abnormality. Soft tissue swelling about the ankle noted. IMPRESSION: No acute fracture or dislocation identified about the left foot. Electronically Signed   By: Fidela Salisbury M.D.   On: 07/13/2018 14:40    Procedures Procedures (including critical care time)  Medications Ordered in UC Medications - No data to display  Initial Impression / Assessment and Plan / UC Course  I have reviewed the triage vital signs and the nursing notes.  Pertinent labs & imaging results that were available during my care of the patient were reviewed by me and considered in my medical decision making (see chart for details).     Hx and exam c/w Left ankle sprain.  Ankle splint provided for comfort Pt has crutches at home Home care instructions provided.  Final Clinical Impressions(s) / UC Diagnoses   Final diagnoses:  Mild ankle sprain, left, initial encounter     Discharge Instructions      You may take 500mg  acetaminophen every 4-6 hours or in combination with ibuprofen 400-600mg  every 6-8 hours as needed for pain and inflammation.  Please follow up with  Sports Medicine in 1-2 weeks if not improving.     ED Prescriptions    None     Controlled Substance Prescriptions [place] Controlled Substance Registry consulted? Not Applicable   Tyrell Antonio 07/13/18 1451

## 2018-12-02 DIAGNOSIS — L814 Other melanin hyperpigmentation: Secondary | ICD-10-CM | POA: Diagnosis not present

## 2018-12-02 DIAGNOSIS — D224 Melanocytic nevi of scalp and neck: Secondary | ICD-10-CM | POA: Diagnosis not present

## 2018-12-02 DIAGNOSIS — I788 Other diseases of capillaries: Secondary | ICD-10-CM | POA: Diagnosis not present

## 2018-12-02 DIAGNOSIS — D2272 Melanocytic nevi of left lower limb, including hip: Secondary | ICD-10-CM | POA: Diagnosis not present

## 2018-12-02 DIAGNOSIS — D2271 Melanocytic nevi of right lower limb, including hip: Secondary | ICD-10-CM | POA: Diagnosis not present

## 2018-12-02 DIAGNOSIS — L821 Other seborrheic keratosis: Secondary | ICD-10-CM | POA: Diagnosis not present

## 2018-12-02 DIAGNOSIS — D2261 Melanocytic nevi of right upper limb, including shoulder: Secondary | ICD-10-CM | POA: Diagnosis not present

## 2018-12-02 DIAGNOSIS — D225 Melanocytic nevi of trunk: Secondary | ICD-10-CM | POA: Diagnosis not present

## 2018-12-02 DIAGNOSIS — L918 Other hypertrophic disorders of the skin: Secondary | ICD-10-CM | POA: Diagnosis not present

## 2020-02-17 ENCOUNTER — Encounter: Payer: Self-pay | Admitting: Family Medicine

## 2020-02-17 ENCOUNTER — Ambulatory Visit (INDEPENDENT_AMBULATORY_CARE_PROVIDER_SITE_OTHER): Payer: No Typology Code available for payment source | Admitting: Family Medicine

## 2020-02-17 ENCOUNTER — Other Ambulatory Visit: Payer: Self-pay

## 2020-02-17 VITALS — BP 104/64 | HR 75 | Temp 95.7°F | Ht 68.0 in | Wt 174.2 lb

## 2020-02-17 DIAGNOSIS — R519 Headache, unspecified: Secondary | ICD-10-CM | POA: Diagnosis not present

## 2020-02-17 DIAGNOSIS — N939 Abnormal uterine and vaginal bleeding, unspecified: Secondary | ICD-10-CM | POA: Diagnosis not present

## 2020-02-17 DIAGNOSIS — R55 Syncope and collapse: Secondary | ICD-10-CM | POA: Diagnosis not present

## 2020-02-17 MED ORDER — KETOROLAC TROMETHAMINE 60 MG/2ML IM SOLN
60.0000 mg | Freq: Once | INTRAMUSCULAR | Status: AC
  Administered 2020-02-17: 60 mg via INTRAMUSCULAR

## 2020-02-17 NOTE — Progress Notes (Signed)
Chief Complaint  Patient presents with  . Headache    2 weeks  . Loss of Consciousness    Subjective: Patient is a 49 y.o. female here for syncope.  For the past 2 weeks, the patient has had a relatively constant right-sided headache.  Normally when she gets a headache it goes away with Tylenol or naproxen.  This has not worked so far.  Today, she had just returned from lunch and was talking to 2 coworkers.  She felt like she was going to pass out and leaned against the wall and temporarily blacked out for several seconds.  She did not fall or hit her head.  She did not lose control of her bowel or bladder function, experience palpitations, or bite her tongue.  The witnesses did not notice any seizure-like activity or convulsions.  She has no history of seizures, heart failure, carotid artery disease, or orthostasis.  She had been eating and drinking normally and denies recent illness or fevers.  Of note, just prior to the onset of her headache she had 5 weeks of abnormal uterine bleeding.  She has an appointment with her gynecologist next month.  Past Medical History:  Diagnosis Date  . Frequent headaches   . History of chicken pox     Objective: BP 104/64 (BP Location: Left Arm, Patient Position: Sitting, Cuff Size: Normal)   Pulse 75   Temp (!) 95.7 F (35.4 C) (Temporal)   Ht 5\' 8"  (1.727 m)   Wt 174 lb 4 oz (79 kg)   SpO2 96%   BMI 26.49 kg/m  General: Awake, appears stated age HEENT: MMM, EOMi Heart: RRR, no murmurs, no bruits, no lower lower extremity edema Neuro: DTRs equal and symmetric throughout, no clonus, no cerebellar signs, speech is fluent; 5/5 strength throughout MSK: Mild tenderness over the right TMJ, no cervical paraspinal musculature tenderness or suboccipital triangle tenderness; there is no tenderness over the temporalis muscle bilaterally Lungs: CTAB, no rales, wheezes or rhonchi. No accessory muscle use Psych: Age appropriate judgment and insight, normal  affect and mood  Assessment and Plan: Syncope, unspecified syncope type - Plan: CBC, IBC + Ferritin, Comprehensive metabolic panel, TSH  Abnormal uterine bleeding (AUB)  Acute intractable headache, unspecified headache type - Plan: ketorolac (TORADOL) injection 60 mg  1-check labs.  Given her lack of heart history and palpitations experienced, will forego a cardiac work-up at this time.  Likely vasovagal.  Could be related to hypovolemia secondary to abnormal uterine bleeding.  If labs are normal and this does not recur, we will not pursue further.  If it happens again, will consider cardiology referral, carotid ultrasound, echo, EKG. 2-could be contributing to #1 3- For the headache, will give IM Toradol.  Doubt intracranial bleed given she has been taking oral NSAIDs and has a normal neurologic exam today. I will see her before July for her physical. The patient voiced understanding and agreement to the plan.  [place], DO 02/17/20  4:33 PM

## 2020-02-17 NOTE — Patient Instructions (Signed)
Give [place] 2-3 business days to get the results of your labs back.   No NSAIDs for the rest of the day.  Stay hydrated, 55-60 oz of water daily.  Let us know if you need anything.

## 2020-02-18 ENCOUNTER — Telehealth: Payer: Self-pay | Admitting: Emergency Medicine

## 2020-02-18 DIAGNOSIS — D649 Anemia, unspecified: Secondary | ICD-10-CM

## 2020-02-18 LAB — COMPREHENSIVE METABOLIC PANEL
ALT: 7 U/L (ref 0–35)
AST: 10 U/L (ref 0–37)
Albumin: 4.2 g/dL (ref 3.5–5.2)
Alkaline Phosphatase: 42 U/L (ref 39–117)
BUN: 10 mg/dL (ref 6–23)
CO2: 24 mEq/L (ref 19–32)
Calcium: 9.1 mg/dL (ref 8.4–10.5)
Chloride: 106 mEq/L (ref 96–112)
Creatinine, Ser: 0.85 mg/dL (ref 0.40–1.20)
GFR: 70.61 mL/min (ref >60.00)
Glucose, Bld: 113 mg/dL — ABNORMAL HIGH (ref 70–99)
Potassium: 4.1 mEq/L (ref 3.5–5.1)
Sodium: 138 mEq/L (ref 135–145)
Total Bilirubin: 0.2 mg/dL (ref 0.2–1.2)
Total Protein: 6.8 g/dL (ref 6.0–8.3)

## 2020-02-18 LAB — IBC + FERRITIN
Ferritin: 2.8 ng/mL — ABNORMAL LOW (ref 10.0–291.0)
Iron: 11 ug/dL — ABNORMAL LOW (ref 42–145)
Saturation Ratios: 2 % — ABNORMAL LOW (ref 20.0–50.0)
Transferrin: 390 mg/dL — ABNORMAL HIGH (ref 212.0–360.0)

## 2020-02-18 LAB — CBC
HCT: 22.8 % — CL (ref 36.0–46.0)
Hemoglobin: 7.5 g/dL — CL (ref 12.0–15.0)
MCHC: 32.9 g/dL (ref 30.0–36.0)
MCV: 84.1 fl (ref 78.0–100.0)
Platelets: 376 10*3/uL (ref 150.0–400.0)
RBC: 2.71 Mil/uL — ABNORMAL LOW (ref 3.87–5.11)
RDW: 15 % (ref 11.5–15.5)
WBC: 7.7 10*3/uL (ref 4.0–10.5)

## 2020-02-18 LAB — TSH: TSH: 1.74 u[IU]/mL (ref 0.35–4.50)

## 2020-02-18 NOTE — Telephone Encounter (Signed)
"  CRITICAL VALUE STICKER  CRITICAL VALUE:HgB 7.5 Hct 22.8  RECEIVER (on-site recipient of call):Demisha Nokes  DATE & TIME NOTIFIED: 02-18-20 1032  MESSENGER (representative from lab):Karen  MD NOTIFIED: Nani Ravens  TIME OF NOTIFICATION:1044  RESPONSE:

## 2020-02-18 NOTE — Telephone Encounter (Signed)
Phone note routed to Dr. Nani Ravens about Critical Hgb 7.5 & Hct 22.8, I forgot he was off today

## 2020-02-18 NOTE — Telephone Encounter (Signed)
Please see Dr Irene Limbo notes from earlier and place orders and arrange for patient to come in as he recommended.

## 2020-02-18 NOTE — Telephone Encounter (Signed)
We need to have her take iron three times daily, ferrous sulfate is over the counter.Let's have her come in tomorrow and leave another CBC, urine microscopy, and get a fecal immunochemical test. Please place orders. If she has any episode of lightheadedness or experiences any dizziness, she needs to go to ER.

## 2020-02-19 ENCOUNTER — Other Ambulatory Visit: Payer: Self-pay | Admitting: Emergency Medicine

## 2020-02-19 ENCOUNTER — Other Ambulatory Visit: Payer: Self-pay

## 2020-02-19 ENCOUNTER — Other Ambulatory Visit: Payer: Self-pay | Admitting: Family Medicine

## 2020-02-19 ENCOUNTER — Other Ambulatory Visit (INDEPENDENT_AMBULATORY_CARE_PROVIDER_SITE_OTHER): Payer: No Typology Code available for payment source

## 2020-02-19 DIAGNOSIS — E611 Iron deficiency: Secondary | ICD-10-CM | POA: Diagnosis not present

## 2020-02-19 NOTE — Addendum Note (Signed)
Addended by: Caffie Pinto on: 02/19/2020 12:45 PM   Modules accepted: Orders

## 2020-02-19 NOTE — Telephone Encounter (Signed)
Called the patient today 02/19/20 just seeing this message (I was out of the office on Thursday 02/18/2020). Scheduled her today 02/19/20 at our lab to do instructed labs. IFOB at the front desk for her to pickup. She was informed of results/instructions. Verbalized understanding.

## 2020-02-19 NOTE — Addendum Note (Signed)
Addended by: Caffie Pinto on: 02/19/2020 12:47 PM   Modules accepted: Orders

## 2020-02-20 LAB — CBC
HCT: 25.5 % — ABNORMAL LOW (ref 35.0–45.0)
Hemoglobin: 8 g/dL — ABNORMAL LOW (ref 11.7–15.5)
MCH: 26.6 pg — ABNORMAL LOW (ref 27.0–33.0)
MCHC: 31.4 g/dL — ABNORMAL LOW (ref 32.0–36.0)
MCV: 84.7 fL (ref 80.0–100.0)
MPV: 10.2 fL (ref 7.5–12.5)
Platelets: 403 10*3/uL — ABNORMAL HIGH (ref 140–400)
RBC: 3.01 10*6/uL — ABNORMAL LOW (ref 3.80–5.10)
RDW: 14.1 % (ref 11.0–15.0)
WBC: 6.8 10*3/uL (ref 3.8–10.8)

## 2020-02-20 LAB — URINALYSIS, MICROSCOPIC ONLY
Bacteria, UA: NONE SEEN /HPF
Hyaline Cast: NONE SEEN /LPF
RBC / HPF: NONE SEEN /HPF (ref 0–2)
Squamous Epithelial / HPF: NONE SEEN /HPF (ref <=5)
WBC, UA: NONE SEEN /HPF (ref 0–5)

## 2020-02-22 ENCOUNTER — Encounter: Payer: Self-pay | Admitting: Obstetrics & Gynecology

## 2020-02-22 ENCOUNTER — Ambulatory Visit (INDEPENDENT_AMBULATORY_CARE_PROVIDER_SITE_OTHER): Payer: No Typology Code available for payment source | Admitting: Obstetrics & Gynecology

## 2020-02-22 ENCOUNTER — Other Ambulatory Visit: Payer: Self-pay

## 2020-02-22 ENCOUNTER — Other Ambulatory Visit: Payer: Self-pay | Admitting: Family Medicine

## 2020-02-22 VITALS — BP 109/62 | HR 75 | Ht 68.0 in | Wt 174.1 lb

## 2020-02-22 DIAGNOSIS — N939 Abnormal uterine and vaginal bleeding, unspecified: Secondary | ICD-10-CM | POA: Diagnosis not present

## 2020-02-22 DIAGNOSIS — D649 Anemia, unspecified: Secondary | ICD-10-CM

## 2020-02-22 MED ORDER — MEGESTROL ACETATE 40 MG PO TABS: ORAL_TABLET | ORAL | 0 refills | Status: DC

## 2020-02-22 MED FILL — MEGESTROL 40 MG TABLET: 40 | 44 days supply | Qty: 60 | Fill #0

## 2020-02-22 NOTE — Addendum Note (Signed)
Addended by: Kelle Darting A on: 02/22/2020 11:55 AM   Modules accepted: Orders

## 2020-02-22 NOTE — Progress Notes (Signed)
History:  49 y.o. [id number] here today for eval of AUB. Pt suspects perimenopause. She reports that she has always had 26 day cycles then over the past year her cycles have become irregular. The past 2 months pt had 5 weeks of bleeding with a 1 week break followed by heavy bleeding. She reports heavy bleeding today with passage of clots. She denies pain but, does report weakness. She was seen by her primary care provider and told that she was anemic.    The following portions of the patient's history were reviewed and updated as appropriate: allergies, current medications, past family history, past medical history, past social history, past surgical history and problem list.  Review of Systems:  Pertinent items are noted in HPI.    Objective:  Physical Exam Blood pressure 109/62, pulse 75, height 5\' 8"  (1.727 m), weight 174 lb 1.3 oz (79 kg), last menstrual period 02/20/2020.  CONSTITUTIONAL: Well-developed, well-nourished female in no acute distress.  HENT:  Normocephalic, atraumatic EYES: Conjunctivae and EOM are normal. No scleral icterus.  NECK: Normal range of motion SKIN: Skin is warm and dry. No rash noted. Not diaphoretic.No pallor. [place]: Alert and oriented to person, place, and time. Normal coordination.  Pelvic: deferred  Labs and Imaging CBC Latest Ref Rng & Units 02/19/2020 02/17/2020 11/07/2008  WBC 3.8 - 10.8 Thousand/uL 6.8 7.7 9.5  Hemoglobin 11.7 - 15.5 g/dL 8.0(L) 7.5 Repeated and verified X2.(LL) 13.8  Hematocrit 35.0 - 45.0 % 25.5(L) 22.8 Repeated and verified X2.(LL) 40.0  Platelets 140 - 400 Thousand/uL 403(H) 376.0 259    Assessment & Plan:  AUB- suspect Perimenopausal bleeding with sx anemia. Given pts current heavy bleeding with clots. Will stop cycles with Megace and obtain an [place] and have pt f/u in 2 weeks for an exam.     Megace 80mg  bid x 1 week followed by 40mg  bid daily.  Pelvic US  F/u in 2 weeks   Total face-to-face time with patient was 18 min.   Greater than 50% was spent in counseling and coordination of care with the patient.     Ann Owen L. Ihor Dow, M.D., Cherlynn June'

## 2020-02-23 ENCOUNTER — Other Ambulatory Visit (INDEPENDENT_AMBULATORY_CARE_PROVIDER_SITE_OTHER): Payer: No Typology Code available for payment source

## 2020-02-23 DIAGNOSIS — D649 Anemia, unspecified: Secondary | ICD-10-CM | POA: Diagnosis not present

## 2020-02-23 LAB — FECAL OCCULT BLOOD, IMMUNOCHEMICAL: Fecal Occult Bld: NEGATIVE

## 2020-02-24 ENCOUNTER — Ambulatory Visit (HOSPITAL_BASED_OUTPATIENT_CLINIC_OR_DEPARTMENT_OTHER)
Admission: RE | Admit: 2020-02-24 | Discharge: 2020-02-24 | Disposition: A | Discharge status: Home / Self Care | Payer: No Typology Code available for payment source | Source: Ambulatory Visit | Attending: Obstetrics & Gynecology | Admitting: Obstetrics & Gynecology

## 2020-02-24 ENCOUNTER — Encounter: Payer: Self-pay | Admitting: Family

## 2020-02-24 ENCOUNTER — Inpatient Hospital Stay: Payer: No Typology Code available for payment source | Attending: Family

## 2020-02-24 ENCOUNTER — Telehealth: Payer: Self-pay | Admitting: Family

## 2020-02-24 ENCOUNTER — Other Ambulatory Visit: Payer: Self-pay | Admitting: Family

## 2020-02-24 ENCOUNTER — Other Ambulatory Visit: Payer: Self-pay

## 2020-02-24 ENCOUNTER — Inpatient Hospital Stay (HOSPITAL_BASED_OUTPATIENT_CLINIC_OR_DEPARTMENT_OTHER): Payer: No Typology Code available for payment source | Admitting: Family

## 2020-02-24 VITALS — BP 114/61 | HR 59 | Temp 97.7°F | Ht 68.0 in | Wt 173.0 lb

## 2020-02-24 DIAGNOSIS — N920 Excessive and frequent menstruation with regular cycle: Secondary | ICD-10-CM

## 2020-02-24 DIAGNOSIS — D500 Iron deficiency anemia secondary to blood loss (chronic): Secondary | ICD-10-CM

## 2020-02-24 DIAGNOSIS — R519 Headache, unspecified: Secondary | ICD-10-CM | POA: Insufficient documentation

## 2020-02-24 DIAGNOSIS — N939 Abnormal uterine and vaginal bleeding, unspecified: Secondary | ICD-10-CM | POA: Diagnosis not present

## 2020-02-24 DIAGNOSIS — D631 Anemia in chronic kidney disease: Secondary | ICD-10-CM

## 2020-02-24 DIAGNOSIS — Z79899 Other long term (current) drug therapy: Secondary | ICD-10-CM | POA: Insufficient documentation

## 2020-02-24 LAB — CMP (CANCER CENTER ONLY)
ALT: 6 U/L (ref 0–44)
AST: 9 U/L — ABNORMAL LOW (ref 15–41)
Albumin: 4.3 g/dL (ref 3.5–5.0)
Alkaline Phosphatase: 45 U/L (ref 38–126)
Anion gap: 6 (ref 5–15)
BUN: 12 mg/dL (ref 6–20)
CO2: 24 mmol/L (ref 22–32)
Calcium: 9.5 mg/dL (ref 8.9–10.3)
Chloride: 108 mmol/L (ref 98–111)
Creatinine: 0.81 mg/dL (ref 0.44–1.00)
GFR, Est AFR Am: >60 mL/min (ref >60)
GFR, Estimated: >60 mL/min (ref >60)
Glucose, Bld: 102 mg/dL — ABNORMAL HIGH (ref 70–99)
Potassium: 3.8 mmol/L (ref 3.5–5.1)
Sodium: 138 mmol/L (ref 135–145)
Total Bilirubin: 0.2 mg/dL — ABNORMAL LOW (ref 0.3–1.2)
Total Protein: 6.8 g/dL (ref 6.5–8.1)

## 2020-02-24 LAB — CBC WITH DIFFERENTIAL (CANCER CENTER ONLY)
Abs Immature Granulocytes: 0.03 10*3/uL (ref 0.00–0.07)
Basophils Absolute: 0 10*3/uL (ref 0.0–0.1)
Basophils Relative: 1 %
Eosinophils Absolute: 0.1 10*3/uL (ref 0.0–0.5)
Eosinophils Relative: 1 %
HCT: 24.9 % — ABNORMAL LOW (ref 36.0–46.0)
Hemoglobin: 7.6 g/dL — ABNORMAL LOW (ref 12.0–15.0)
Immature Granulocytes: 1 %
Lymphocytes Relative: 25 %
Lymphs Abs: 1.5 10*3/uL (ref 0.7–4.0)
MCH: 26.4 pg (ref 26.0–34.0)
MCHC: 30.5 g/dL (ref 30.0–36.0)
MCV: 86.5 fL (ref 80.0–100.0)
Monocytes Absolute: 0.4 10*3/uL (ref 0.1–1.0)
Monocytes Relative: 7 %
Neutro Abs: 4 10*3/uL (ref 1.7–7.7)
Neutrophils Relative %: 65 %
Platelet Count: 382 10*3/uL (ref 150–400)
RBC: 2.88 MIL/uL — ABNORMAL LOW (ref 3.87–5.11)
RDW: 14.4 % (ref 11.5–15.5)
WBC Count: 6 10*3/uL (ref 4.0–10.5)
nRBC: 0 % (ref 0.0–0.2)

## 2020-02-24 LAB — RETICULOCYTES
Immature Retic Fract: 7.7 % (ref 2.3–15.9)
RBC.: 2.85 MIL/uL — ABNORMAL LOW (ref 3.87–5.11)
Retic Count, Absolute: 79 10*3/uL (ref 19.0–186.0)
Retic Ct Pct: 2.8 % (ref 0.4–3.1)

## 2020-02-24 LAB — SAVE SMEAR(SSMR), FOR PROVIDER SLIDE REVIEW

## 2020-02-24 NOTE — Telephone Encounter (Signed)
Appointments scheduled calendar printed per 4/21 los 

## 2020-02-24 NOTE — Progress Notes (Signed)
Hematology/Oncology Consultation   Name: Ann Owen      MRN: [id number]    Location: Room/bed info not found  Date: 02/24/2020 Time:11:22 AM   REFERRING PHYSICIAN: Hart Carwin P. Nani Ravens, MD  REASON FOR CONSULT: Symptomtic anemia    DIAGNOSIS: Iron deficiency anemia secondary to heavy cycles  HISTORY OF PRESENT ILLNESS: Ann Owen is a very pleasant 49 yo caucasian female with recent history of anemia secondary to heavy irregular cycles over the last couple months. She has noted clots as well.  She was able to see her gynecologist and perimenopause is suspected.  She is currently on Megace (day 3/14) and has noted that she now has a normal flow but still has clots.  She is scheduled for TV US today.  Hgb has dropped despite taking oral iron TID.  Hgb today is 7.6 and MCV 86. Iron saturation last week was 2% and ferritin 2.  She has not noted any other bleeding. No bruising or petechiae.  She has 2 children and no history of miscarriage.  She has not yet had her colonoscopy due to the pandemic but does plan to schedule when able.  She is symptomatic with fatigue, chills, dizziness, increased headaches and feels winded with exertion.  She has had intermittent numbness and tingling in her hands and legs.  No swelling in her extremities.  She did have a near syncopal episode but was able to sit down and rest until it subsided.  No falls.  No family history of anemia or bleeding issues that she is aware of.  No personal history of cancer.  Family history includes maternal grandmother - bone and both grandfathers - prostate.  She denies fever, chills, n/v, cough, rash, chest pain, palpitations, abdominal pain or changes in bowel or bladder habits.  No history of diabetes or thyroid disease.  She has maintained a good appetite (she does eat meat) and is staying well hydrated. Her weight is described as stable.  She does not smoke or use recreational drugs. She rarely has a glass of wine.  She  is typically very active going to the gym but had to stop a week or so ago due to the worsening of her symptoms.   ROS: All other 10 point review of systems is negative.   PAST MEDICAL HISTORY:   Past Medical History:  Diagnosis Date  . Frequent headaches   . History of chicken pox     ALLERGIES: No Known Allergies    MEDICATIONS:  Current Outpatient Medications on File Prior to Visit  Medication Sig Dispense Refill  . atorvastatin (LIPITOR) 40 MG tablet     . diclofenac (VOLTAREN) 75 MG EC tablet     . ferrous sulfate 325 (65 FE) MG EC tablet Take 325 mg by mouth 3 (three) times daily with meals.    . fluconazole (DIFLUCAN) 150 MG tablet     . megestrol (MEGACE) 40 MG tablet Take 2 tablets twice a day for 1 week then 1 tablet twice a day. 60 tablet 0  . PREVIFEM 0.25-35 MG-MCG tablet      No current facility-administered medications on file prior to visit.     PAST SURGICAL HISTORY Past Surgical History:  Procedure Laterality Date  . NO PAST SURGERIES      FAMILY HISTORY: Family History  Problem Relation Age of Onset  . Hyperlipidemia Mother   . Arthritis Father   . Cancer Maternal Grandmother        Bone  SOCIAL HISTORY:  reports that she has never smoked. She has never used smokeless tobacco. She reports that she does not drink alcohol or use drugs.  PERFORMANCE STATUS: The patient's performance status is 1 - Symptomatic but completely ambulatory  PHYSICAL EXAM: Most Recent Vital Signs: Last menstrual period 02/20/2020. LMP 02/20/2020   General Appearance:    Alert, cooperative, no distress, appears stated age  Head:    Normocephalic, without obvious abnormality, atraumatic  Eyes:    PERRL, conjunctiva/corneas clear, EOM's intact, fundi    benign, both eyes        Throat:   Lips, mucosa, and tongue normal; teeth and gums normal  Neck:   Supple, symmetrical, trachea midline, no adenopathy;    thyroid:  no enlargement/tenderness/nodules; no carotid    bruit or JVD  Back:     Symmetric, no curvature, ROM normal, no CVA tenderness  Lungs:     Clear to auscultation bilaterally, respirations unlabored  Chest Wall:    No tenderness or deformity   Heart:    Regular rate and rhythm, S1 and S2 normal, no murmur, rub   or gallop     Abdomen:     Soft, non-tender, bowel sounds active all four quadrants,    no masses, no organomegaly        Extremities:   Extremities normal, atraumatic, no cyanosis or edema  Pulses:   2+ and symmetric all extremities  Skin:   Skin color, texture, turgor normal, no rashes or lesions  Lymph nodes:   Cervical, supraclavicular, and axillary nodes normal  Neurologic:   CNII-XII intact, normal strength, sensation and reflexes    throughout    LABORATORY DATA:  Results for orders placed or performed in visit on 02/24/20 (from the past 48 hour(s))  Reticulocytes     Status: Abnormal   Collection Time: 02/24/20 10:51 AM  Result Value Ref Range   Retic Ct Pct 2.8 0.4 - 3.1 %   RBC. 2.85 (L) 3.87 - 5.11 MIL/uL   Retic Count, Absolute 79.0 19.0 - 186.0 K/uL   Immature Retic Fract 7.7 2.3 - 15.9 %    Comment: Performed at [facility] at [facility], [address], [place], [place] [address]  Save Smear [facility])     Status: None   Collection Time: 02/24/20 10:51 AM  Result Value Ref Range   Smear Review SMEAR STAINED AND AVAILABLE FOR REVIEW     Comment: Performed at [facility] at [facility], [address] [place], [place] [address]  CBC with Differential (Cancer Center Only)     Status: Abnormal   Collection Time: 02/24/20 10:51 AM  Result Value Ref Range   WBC Count 6.0 4.0 - 10.5 K/uL   RBC 2.88 (L) 3.87 - 5.11 MIL/uL   Hemoglobin 7.6 (L) 12.0 - 15.0 g/dL   HCT 24.9 (L) 36.0 - 46.0 %   MCV 86.5 80.0 - 100.0 fL   MCH 26.4 26.0 - 34.0 pg   MCHC 30.5 30.0 - 36.0 g/dL   RDW 14.4 11.5 - 15.5 %   Platelet Count 382 150 - 400 K/uL   nRBC 0.0 0.0 - 0.2 %    Neutrophils Relative % 65 %   Neutro Abs 4.0 1.7 - 7.7 K/uL   Lymphocytes Relative 25 %   Lymphs Abs 1.5 0.7 - 4.0 K/uL   Monocytes Relative 7 %   Monocytes Absolute 0.4 0.1 - 1.0 K/uL  Eosinophils Relative 1 %   Eosinophils Absolute 0.1 0.0 - 0.5 K/uL   Basophils Relative 1 %   Basophils Absolute 0.0 0.0 - 0.1 K/uL   Immature Granulocytes 1 %   Abs Immature Granulocytes 0.03 0.00 - 0.07 K/uL    Comment: Performed at [facility] at [facility], [address], [place], [place] [address]      RADIOGRAPHY: No results found.     PATHOLOGY: None  ASSESSMENT/PLAN: Ann Owen is a very pleasant 49 yo caucasian female with recent history of anemia secondary to heavy irregular cycles over the last couple months. She is symptomatic today as mentioned above and has failed oral iron.  We will get her set up for IV iron on her PA is approved and then plan to see her back in another 4 weeks.    All questions were answered and she is in agreement. She will contact our office with any questions or concerns. We can certainly see her sooner if needed.   She was discussed with and also seen by Dr. Marin Olp and he is in agreement with the aforementioned.   Ann Peace, NP    Addendum:   I saw and examined Ann Owen with Ann Owen.  I agree with the above assessment.  She clearly has iron deficiency.  I have to believe this is from her monthly cycles.  She had iron studies done just a week ago.  Her ferritin was only 2.8%.  Her iron saturation was 2%.  Thankfully, she is in very good health.  She seems to be athletic.  I know that she does a lot of walking.  She is a Occupational psychologist at [employer].  I would think that the IV iron will help her out.  I would think that her gynecologist can somehow get her monthly cycles stopped.  I would think she is should be close to her menopause state.  I would not think that she would need a hysterectomy.  I will get her blood  smear.  She clearly has some anisocytosis and poikilocytosis.  I do not see that she needs to have any radiographic studies.  I do not see that she needs to have any endoscopic procedures.  There is no evidence that there is any bleeding outside of that with her monthly cycles.  We spent about 45 minutes with Ann Owen.  She was a lot of fun to talk to.  She is originally from [place].  As such, we talked a lot about [place] and the sports teams in [place] and of course, the [employer].  We will plan to get her back to see [place] in about a month or so.  I would think that by then, her hemoglobin should be a lot better as she will be feeling better.  Lattie Haw.,MD

## 2020-02-25 LAB — IRON AND TIBC
Iron: 18 ug/dL — ABNORMAL LOW (ref 41–142)
Saturation Ratios: 4 % — ABNORMAL LOW (ref 21–57)
TIBC: 458 ug/dL — ABNORMAL HIGH (ref 236–444)
UIBC: 440 ug/dL — ABNORMAL HIGH (ref 120–384)

## 2020-02-25 LAB — FERRITIN: Ferritin: 29 ng/mL (ref 11–307)

## 2020-02-25 LAB — ERYTHROPOIETIN: Erythropoietin: 60.2 m[IU]/mL — ABNORMAL HIGH (ref 2.6–18.5)

## 2020-02-25 LAB — LACTATE DEHYDROGENASE: LDH: 139 U/L (ref 98–192)

## 2020-02-29 ENCOUNTER — Other Ambulatory Visit: Payer: Self-pay | Admitting: Family

## 2020-02-29 DIAGNOSIS — D509 Iron deficiency anemia, unspecified: Secondary | ICD-10-CM | POA: Insufficient documentation

## 2020-02-29 DIAGNOSIS — D508 Other iron deficiency anemias: Secondary | ICD-10-CM

## 2020-03-02 ENCOUNTER — Ambulatory Visit: Payer: No Typology Code available for payment source | Admitting: Family

## 2020-03-02 ENCOUNTER — Inpatient Hospital Stay: Payer: No Typology Code available for payment source

## 2020-03-04 ENCOUNTER — Inpatient Hospital Stay: Payer: No Typology Code available for payment source

## 2020-03-04 ENCOUNTER — Other Ambulatory Visit: Payer: Self-pay

## 2020-03-04 VITALS — BP 115/57 | HR 70 | Temp 98.2°F | Resp 17

## 2020-03-04 DIAGNOSIS — D508 Other iron deficiency anemias: Secondary | ICD-10-CM

## 2020-03-04 DIAGNOSIS — D500 Iron deficiency anemia secondary to blood loss (chronic): Secondary | ICD-10-CM | POA: Diagnosis not present

## 2020-03-04 DIAGNOSIS — N920 Excessive and frequent menstruation with regular cycle: Secondary | ICD-10-CM | POA: Diagnosis not present

## 2020-03-04 DIAGNOSIS — R519 Headache, unspecified: Secondary | ICD-10-CM | POA: Diagnosis not present

## 2020-03-04 DIAGNOSIS — Z79899 Other long term (current) drug therapy: Secondary | ICD-10-CM | POA: Diagnosis not present

## 2020-03-04 MED ORDER — SODIUM CHLORIDE 0.9 % IV SOLN
200.0000 mg | Freq: Once | INTRAVENOUS | Status: AC
  Administered 2020-03-04: 200 mg via INTRAVENOUS
  Filled 2020-03-04: qty 200

## 2020-03-04 MED ORDER — SODIUM CHLORIDE 0.9 % IV SOLN
Freq: Once | INTRAVENOUS | Status: AC
  Filled 2020-03-04: qty 250

## 2020-03-04 NOTE — Patient Instructions (Signed)

## 2020-03-07 ENCOUNTER — Encounter: Payer: Self-pay | Admitting: Obstetrics & Gynecology

## 2020-03-07 ENCOUNTER — Ambulatory Visit (INDEPENDENT_AMBULATORY_CARE_PROVIDER_SITE_OTHER): Payer: No Typology Code available for payment source | Admitting: Obstetrics & Gynecology

## 2020-03-07 ENCOUNTER — Other Ambulatory Visit (HOSPITAL_COMMUNITY)
Admission: RE | Admit: 2020-03-07 | Discharge: 2020-03-07 | Disposition: A | Discharge status: Home / Self Care | Payer: No Typology Code available for payment source | Source: Ambulatory Visit | Attending: Obstetrics & Gynecology | Admitting: Obstetrics & Gynecology

## 2020-03-07 ENCOUNTER — Other Ambulatory Visit: Payer: Self-pay

## 2020-03-07 ENCOUNTER — Inpatient Hospital Stay: Payer: No Typology Code available for payment source | Attending: Hematology & Oncology

## 2020-03-07 VITALS — BP 93/61 | HR 55 | Temp 98.0°F | Resp 17

## 2020-03-07 VITALS — BP 110/68 | HR 65 | Ht 68.0 in | Wt 172.0 lb

## 2020-03-07 DIAGNOSIS — N83202 Unspecified ovarian cyst, left side: Secondary | ICD-10-CM

## 2020-03-07 DIAGNOSIS — Z01419 Encounter for gynecological examination (general) (routine) without abnormal findings: Secondary | ICD-10-CM | POA: Diagnosis not present

## 2020-03-07 DIAGNOSIS — D500 Iron deficiency anemia secondary to blood loss (chronic): Secondary | ICD-10-CM | POA: Insufficient documentation

## 2020-03-07 DIAGNOSIS — N939 Abnormal uterine and vaginal bleeding, unspecified: Secondary | ICD-10-CM

## 2020-03-07 DIAGNOSIS — N951 Menopausal and female climacteric states: Secondary | ICD-10-CM

## 2020-03-07 DIAGNOSIS — N920 Excessive and frequent menstruation with regular cycle: Secondary | ICD-10-CM | POA: Insufficient documentation

## 2020-03-07 DIAGNOSIS — D508 Other iron deficiency anemias: Secondary | ICD-10-CM

## 2020-03-07 MED ORDER — SODIUM CHLORIDE 0.9 % IV SOLN
Freq: Once | INTRAVENOUS | Status: AC
  Filled 2020-03-07: qty 250

## 2020-03-07 MED ORDER — SODIUM CHLORIDE 0.9 % IV SOLN
200.0000 mg | Freq: Once | INTRAVENOUS | Status: AC
  Administered 2020-03-07: 200 mg via INTRAVENOUS
  Filled 2020-03-07: qty 10

## 2020-03-07 NOTE — Addendum Note (Signed)
Addended by: Wendelyn Breslow L on: 03/07/2020 01:02 PM   Modules accepted: Orders

## 2020-03-07 NOTE — Patient Instructions (Signed)

## 2020-03-07 NOTE — Progress Notes (Signed)
Subjective:     Ann Owen is a 49 y.o. female here for a routine exam.  Current complaints: Bleeding si much less than prev. She still reports some daily spotting but, not the heavy bleeding. Currently on Megace 40mg  bid.   Gynecologic History Patient's last menstrual period was 02/20/2020. Contraception: condoms Last Pap: >5 years prev. Results were: normal Last mammogram: *08/27/2016. Results were: normal  Obstetric History OB History  Gravida Para Term Preterm AB Living  2 2 2     2   SAB TAB Ectopic Multiple Live Births          2    # Outcome Date GA Lbr Len/2nd Weight Sex Delivery Anes PTL Lv  2 Term 1999 [redacted]w[redacted]d   F Vag-Spont EPI N LIV  1 Term 75 [redacted]w[redacted]d   M Vag-Spont EPI N LIV    The following portions of the patient's history were reviewed and updated as appropriate: allergies, current medications, past family history, past medical history, past social history, past surgical history and problem list.  Review of Systems Pertinent items are noted in HPI.    Objective:  BP 110/68   Pulse 65   Ht 5\' 8"  (1.727 m)   Wt 172 lb (78 kg)   LMP 02/20/2020   BMI 26.15 kg/m   General Appearance:    Alert, cooperative, no distress, appears stated age  Head:    Normocephalic, without obvious abnormality, atraumatic  Eyes:    conjunctiva/corneas clear, EOM's intact, both eyes  Ears:    Normal external ear canals, both ears  Nose:   Nares normal, septum midline, mucosa normal, no drainage    or sinus tenderness  Throat:   Lips, mucosa, and tongue normal; teeth and gums normal  Neck:   Supple, symmetrical, trachea midline, no adenopathy;    thyroid:  no enlargement/tenderness/nodules  Back:     Symmetric, no curvature, ROM normal, no CVA tenderness  Lungs:     respirations unlabored  Chest Wall:    No tenderness or deformity   Heart:    Regular rate and rhythm  Breast Exam:    No tenderness, masses, or nipple abnormality  Abdomen:     Soft, non-tender, bowel sounds active all  four quadrants,    no masses, no organomegaly  Genitalia:    Normal female without lesion, discharge or tenderness   Some left adnexal fullness with a enlarged but, mobile left ovary. The uterus is small and mobile.   Extremities:   Extremities normal, atraumatic, no cyanosis or edema  Pulses:   2+ and symmetric all extremities  Skin:   Skin color, texture, turgor normal, no rashes or lesions  02/24/2020 CLINICAL DATA:  Initial evaluation for abnormal uterine bleeding, perimenopausal bleeding.  EXAM: ULTRASOUND PELVIS TRANSVAGINAL  TECHNIQUE: Transvaginal ultrasound examination of the pelvis was performed including evaluation of the uterus, ovaries, adnexal regions, and pelvic cul-de-sac.  COMPARISON:  None available.  FINDINGS: Uterus  Measurements: 8.6 x 5.1 x 6.0 cm = volume: 136.8 mL. Uterus demonstrates a heterogeneous echotexture. Possible superimposed 8 mm lesion at the posterior uterine body could reflect a small intramural fibroid.  Endometrium  Thickness: 6.2 mm.  No focal abnormality visualized.  Right ovary  Not visualized.  No adnexal mass.  Left ovary  Measurements: 4.0 x 3.9 x 3.7 cm = volume: 30.8 mL. 3.6 x 2.4 x 2.8 cm complex cyst with low-level internal echoes. No internal vascularity or visible solid component. Adjacent 0.8 x 1.5 x 1.7 cm  simple cyst present as well.  Other findings:  No abnormal free fluid  IMPRESSION: 1. Endometrial stripe measures 6.2 mm in thickness. If bleeding remains unresponsive to hormonal or medical therapy, sonohysterogram should be considered for focal lesion work-up. (Ref: Radiological Reasoning: Algorithmic Workup of Abnormal Vaginal Bleeding with Endovaginal Sonography and Sonohysterography. AJR 2008GA:[id number]). 2. 3.6 cm complex left ovarian cyst, indeterminate. Differential considerations include a hemorrhagic cyst or possibly endometrioma. Short interval follow-up ultrasound in 6-12 weeks  suggested for further evaluation. 3. Additional 1.7 cm simple left ovarian cyst, most consistent with a benign follicular cyst. 4. Question 8 mm intramural fibroid at the posterior uterine body.   Assessment:    Healthy female exam.   AUB- suspect due to perimenopause. Endometrial stripe 100mm. Sx improved with Megace. Left ov cyst- complex     Plan:   F/u PAP with hrHPV Screening mammogram,  referral to GI for screening colonoscopy.    Keep Megace 40mg  bid F/u in 4 weeks. If cont'd bleeding, Pt will need endo bx F/u pelvic US to eval resolution vs persistence of left ov cyst  Yvonna Brun L. Harraway-Smith, M.D., Cherlynn June

## 2020-03-07 NOTE — Patient Instructions (Signed)

## 2020-03-08 LAB — CYTOLOGY - PAP
Comment: NEGATIVE
Diagnosis: NEGATIVE
High risk HPV: NEGATIVE

## 2020-03-11 ENCOUNTER — Ambulatory Visit: Payer: No Typology Code available for payment source

## 2020-03-14 ENCOUNTER — Ambulatory Visit: Payer: No Typology Code available for payment source

## 2020-03-14 ENCOUNTER — Encounter: Payer: 59 | Admitting: Obstetrics & Gynecology

## 2020-03-15 ENCOUNTER — Inpatient Hospital Stay: Payer: No Typology Code available for payment source

## 2020-03-15 ENCOUNTER — Other Ambulatory Visit: Payer: Self-pay

## 2020-03-15 VITALS — BP 106/56 | HR 58 | Temp 97.7°F

## 2020-03-15 DIAGNOSIS — D508 Other iron deficiency anemias: Secondary | ICD-10-CM

## 2020-03-15 DIAGNOSIS — D500 Iron deficiency anemia secondary to blood loss (chronic): Secondary | ICD-10-CM | POA: Diagnosis not present

## 2020-03-15 MED ORDER — SODIUM CHLORIDE 0.9 % IV SOLN
Freq: Once | INTRAVENOUS | Status: AC
  Filled 2020-03-15: qty 250

## 2020-03-15 MED ORDER — SODIUM CHLORIDE 0.9 % IV SOLN
200.0000 mg | Freq: Once | INTRAVENOUS | Status: AC
  Administered 2020-03-15: 200 mg via INTRAVENOUS
  Filled 2020-03-15: qty 200

## 2020-03-15 NOTE — Patient Instructions (Signed)

## 2020-03-16 ENCOUNTER — Other Ambulatory Visit: Payer: Self-pay

## 2020-03-16 DIAGNOSIS — N939 Abnormal uterine and vaginal bleeding, unspecified: Secondary | ICD-10-CM

## 2020-03-16 MED ORDER — MEGESTROL ACETATE 40 MG PO TABS: ORAL_TABLET | ORAL | 1 refills | Status: DC

## 2020-03-16 NOTE — Progress Notes (Signed)
Refill request for patient's Megace sent to office. Refilled per last office note on 03/07/20

## 2020-03-18 ENCOUNTER — Other Ambulatory Visit: Payer: Self-pay

## 2020-03-18 ENCOUNTER — Inpatient Hospital Stay: Payer: No Typology Code available for payment source

## 2020-03-18 VITALS — BP 105/52 | HR 63 | Temp 97.1°F | Resp 17

## 2020-03-18 DIAGNOSIS — D500 Iron deficiency anemia secondary to blood loss (chronic): Secondary | ICD-10-CM | POA: Diagnosis not present

## 2020-03-18 DIAGNOSIS — D508 Other iron deficiency anemias: Secondary | ICD-10-CM

## 2020-03-18 MED ORDER — SODIUM CHLORIDE 0.9 % IV SOLN
200.0000 mg | Freq: Once | INTRAVENOUS | Status: AC
  Administered 2020-03-18: 200 mg via INTRAVENOUS
  Filled 2020-03-18: qty 200

## 2020-03-18 MED ORDER — SODIUM CHLORIDE 0.9 % IV SOLN
Freq: Once | INTRAVENOUS | Status: AC
  Filled 2020-03-18: qty 250

## 2020-03-21 ENCOUNTER — Inpatient Hospital Stay: Payer: No Typology Code available for payment source

## 2020-03-21 ENCOUNTER — Other Ambulatory Visit: Payer: Self-pay

## 2020-03-21 VITALS — BP 112/66 | HR 59 | Temp 97.5°F | Resp 17

## 2020-03-21 DIAGNOSIS — D500 Iron deficiency anemia secondary to blood loss (chronic): Secondary | ICD-10-CM | POA: Diagnosis not present

## 2020-03-21 DIAGNOSIS — D508 Other iron deficiency anemias: Secondary | ICD-10-CM

## 2020-03-21 MED ORDER — SODIUM CHLORIDE 0.9 % IV SOLN
Freq: Once | INTRAVENOUS | Status: AC
  Filled 2020-03-21: qty 250

## 2020-03-21 MED ORDER — SODIUM CHLORIDE 0.9 % IV SOLN
200.0000 mg | Freq: Once | INTRAVENOUS | Status: AC
  Administered 2020-03-21: 200 mg via INTRAVENOUS
  Filled 2020-03-21: qty 200

## 2020-03-21 NOTE — Patient Instructions (Addendum)

## 2020-03-25 ENCOUNTER — Other Ambulatory Visit: Payer: Self-pay

## 2020-03-25 ENCOUNTER — Inpatient Hospital Stay: Payer: No Typology Code available for payment source

## 2020-03-25 ENCOUNTER — Encounter: Payer: Self-pay | Admitting: Family

## 2020-03-25 ENCOUNTER — Inpatient Hospital Stay (HOSPITAL_BASED_OUTPATIENT_CLINIC_OR_DEPARTMENT_OTHER): Payer: No Typology Code available for payment source | Admitting: Family

## 2020-03-25 VITALS — BP 107/60 | HR 64 | Temp 97.3°F | Resp 18 | Ht 68.0 in | Wt 169.0 lb

## 2020-03-25 DIAGNOSIS — D500 Iron deficiency anemia secondary to blood loss (chronic): Secondary | ICD-10-CM

## 2020-03-25 LAB — CBC WITH DIFFERENTIAL (CANCER CENTER ONLY)
Abs Immature Granulocytes: 0.02 10*3/uL (ref 0.00–0.07)
Basophils Absolute: 0 10*3/uL (ref 0.0–0.1)
Basophils Relative: 1 %
Eosinophils Absolute: 0.1 10*3/uL (ref 0.0–0.5)
Eosinophils Relative: 1 %
HCT: 33.6 % — ABNORMAL LOW (ref 36.0–46.0)
Hemoglobin: 10.5 g/dL — ABNORMAL LOW (ref 12.0–15.0)
Immature Granulocytes: 0 %
Lymphocytes Relative: 25 %
Lymphs Abs: 1.4 10*3/uL (ref 0.7–4.0)
MCH: 28 pg (ref 26.0–34.0)
MCHC: 31.3 g/dL (ref 30.0–36.0)
MCV: 89.6 fL (ref 80.0–100.0)
Monocytes Absolute: 0.5 10*3/uL (ref 0.1–1.0)
Monocytes Relative: 8 %
Neutro Abs: 3.7 10*3/uL (ref 1.7–7.7)
Neutrophils Relative %: 65 %
Platelet Count: 314 10*3/uL (ref 150–400)
RBC: 3.75 MIL/uL — ABNORMAL LOW (ref 3.87–5.11)
RDW: 17.6 % — ABNORMAL HIGH (ref 11.5–15.5)
WBC Count: 5.7 10*3/uL (ref 4.0–10.5)
nRBC: 0 % (ref 0.0–0.2)

## 2020-03-25 LAB — RETICULOCYTES
Immature Retic Fract: 14.6 % (ref 2.3–15.9)
RBC.: 3.75 MIL/uL — ABNORMAL LOW (ref 3.87–5.11)
Retic Count, Absolute: 54 10*3/uL (ref 19.0–186.0)
Retic Ct Pct: 1.4 % (ref 0.4–3.1)

## 2020-03-25 NOTE — Progress Notes (Signed)
Hematology and Oncology Follow Up Visit  Ann Owen [id number] 01-01-1971 49 y.o. 03/25/2020   Principle Diagnosis:  Iron deficiency anemia secondary to heavy cycles  Current Therapy: IV iron as indicated    Interim History:  Ann Owen is is here today for follow-up. She is feeling so much better after several doses of Venofer.  She still has some mild SOB after walking a long distance but this resolves after taking a few minutes to rest.  She has been on Megace which seems to have stopped her cycle for the most part.  No fever, chills, n/v, cough, rash, dizziness, chest pain, palpitations, abdominal pain or changes in bowel or bladder habits.  No swelling, tenderness, numbness or tingling in her extremities at this time.  No falls or syncopal episode to report.  She has maintained a good appetite and is staying well hydrated. Her weight is stable.   ECOG Performance Status: 1 - Symptomatic but completely ambulatory  Medications:  Allergies as of 03/25/2020   No Known Allergies     Medication List       Accurate as of Mar 25, 2020 11:25 AM. If you have any questions, ask your nurse or doctor.        STOP taking these medications   atorvastatin 40 MG tablet Commonly known as: LIPITOR Stopped by: Laverna Peace, NP   diclofenac 75 MG EC tablet Commonly known as: VOLTAREN Stopped by: Laverna Peace, NP   fluconazole 150 MG tablet Commonly known as: DIFLUCAN Stopped by: Laverna Peace, NP   Previfem 0.25-35 MG-MCG tablet Generic drug: norgestimate-ethinyl estradiol Stopped by: Laverna Peace, NP     TAKE these medications   ferrous sulfate 325 (65 FE) MG EC tablet Take 325 mg by mouth 3 (three) times daily with meals.   megestrol 40 MG tablet Commonly known as: MEGACE 1 tablet by mouth BID       Allergies: No Known Allergies  Past Medical History, Surgical history, Social history, and Family History were reviewed and updated.  Review of Systems:  All other 10 point review of systems is negative.   Physical Exam:  height is 5\' 8"  (1.727 m) and weight is 169 lb (76.7 kg). Her temporal temperature is 97.3 F (36.3 C) (abnormal). Her blood pressure is 107/60 and her pulse is 64. Her respiration is 18 and oxygen saturation is 100%.   Wt Readings from Last 3 Encounters:  03/25/20 169 lb (76.7 kg)  03/07/20 172 lb (78 kg)  02/24/20 173 lb (78.5 kg)    Ocular: Sclerae unicteric, pupils equal, round and reactive to light Ear-nose-throat: Oropharynx clear, dentition fair Lymphatic: No cervical or supraclavicular adenopathy Lungs no rales or rhonchi, good excursion bilaterally Heart regular rate and rhythm, no murmur appreciated Abd soft, nontender, positive bowel sounds, no liver or spleen tip palpated on exam, no fluid wave  MSK no focal spinal tenderness, no joint edema Neuro: non-focal, well-oriented, appropriate affect Breasts: Deferred   Lab Results  Component Value Date   WBC 5.7 03/25/2020   HGB 10.5 (L) 03/25/2020   HCT 33.6 (L) 03/25/2020   MCV 89.6 03/25/2020   PLT 314 03/25/2020   Lab Results  Component Value Date   FERRITIN 29 02/24/2020   IRON 18 (L) 02/24/2020   TIBC 458 (H) 02/24/2020   UIBC 440 (H) 02/24/2020   IRONPCTSAT 4 (L) 02/24/2020   Lab Results  Component Value Date   RETICCTPCT 1.4 03/25/2020   RBC 3.75 (L) 03/25/2020   No  results found for: KPAFRELGTCHN, LAMBDASER, KAPLAMBRATIO No results found for: IGGSERUM, IGA, IGMSERUM No results found for: Ronnald Ramp, A1GS, A2GS, Tillman Sers, SPEI   Chemistry      Component Value Date/Time   NA 138 02/24/2020 1051   K 3.8 02/24/2020 1051   CL 108 02/24/2020 1051   CO2 24 02/24/2020 1051   BUN 12 02/24/2020 1051   CREATININE 0.81 02/24/2020 1051   CREATININE 0.79 07/02/2016 1010      Component Value Date/Time   CALCIUM 9.5 02/24/2020 1051   ALKPHOS 45 02/24/2020 1051   AST 9 (L) 02/24/2020 1051   ALT 6 02/24/2020  1051   BILITOT 0.2 (L) 02/24/2020 1051       Impression and Plan:  Ann Owen is a very pleasant 49 yo caucasian female with recent history of anemia secondary to heavy irregular cycles. She has responded nicely to IV iron and is feeling much better.  We will see what her iron studies look like and replace if needed.  We will plan to see her in another 3 months.  She will contact our office with any questions or concerns. We can certainly see her sooner if needed.   Laverna Peace, NP 5/21/202111:25 AM

## 2020-03-28 LAB — IRON AND TIBC
Iron: 76 ug/dL (ref 41–142)
Saturation Ratios: 23 % (ref 21–57)
TIBC: 333 ug/dL (ref 236–444)
UIBC: 257 ug/dL (ref 120–384)

## 2020-03-28 LAB — FERRITIN: Ferritin: 225 ng/mL (ref 11–307)

## 2020-03-29 MED FILL — FEOSOL BIFERA 28 MG TABS: 28 | 30 days supply | Qty: 30 | Fill #0

## 2020-03-30 MED FILL — FEOSOL BIFERA 28 MG TABS: 28 | 30 days supply | Qty: 30 | Fill #1

## 2020-04-06 ENCOUNTER — Other Ambulatory Visit (HOSPITAL_COMMUNITY)
Admission: RE | Admit: 2020-04-06 | Discharge: 2020-04-06 | Disposition: A | Discharge status: Home / Self Care | Payer: No Typology Code available for payment source | Source: Ambulatory Visit | Attending: Obstetrics & Gynecology | Admitting: Obstetrics & Gynecology

## 2020-04-06 ENCOUNTER — Encounter: Payer: Self-pay | Admitting: Obstetrics & Gynecology

## 2020-04-06 ENCOUNTER — Other Ambulatory Visit: Payer: Self-pay

## 2020-04-06 ENCOUNTER — Ambulatory Visit (INDEPENDENT_AMBULATORY_CARE_PROVIDER_SITE_OTHER): Payer: No Typology Code available for payment source | Admitting: Obstetrics & Gynecology

## 2020-04-06 VITALS — BP 103/52 | HR 57 | Ht 68.0 in | Wt 168.0 lb

## 2020-04-06 DIAGNOSIS — D500 Iron deficiency anemia secondary to blood loss (chronic): Secondary | ICD-10-CM | POA: Diagnosis not present

## 2020-04-06 DIAGNOSIS — N924 Excessive bleeding in the premenopausal period: Secondary | ICD-10-CM | POA: Insufficient documentation

## 2020-04-06 DIAGNOSIS — N83202 Unspecified ovarian cyst, left side: Secondary | ICD-10-CM | POA: Diagnosis not present

## 2020-04-06 DIAGNOSIS — N939 Abnormal uterine and vaginal bleeding, unspecified: Secondary | ICD-10-CM

## 2020-04-06 NOTE — Progress Notes (Signed)
History:  49 y.o. [id number] here today for f/u of abnormal perimenopausal bleeding. Pt reports that the bleeding is lighter but, still present. May skip 2-3 days but, that's it. Would like to consider an ablation.    The following portions of the patient's history were reviewed and updated as appropriate: allergies, current medications, past family history, past medical history, past social history, past surgical history and problem list.  Review of Systems:  Pertinent items are noted in HPI.    Objective:  Physical Exam Blood pressure (!) 103/52, pulse (!) 57, height 5\' 8"  (1.727 m), weight 168 lb (76.2 kg), last menstrual period 02/06/2020.  CONSTITUTIONAL: Well-developed, well-nourished female in no acute distress.  HENT:  Normocephalic, atraumatic EYES: Conjunctivae and EOM are normal. No scleral icterus.  NECK: Normal range of motion SKIN: Skin is warm and dry. No rash noted. Not diaphoretic.No pallor. [place]: Alert and oriented to person, place, and time. Normal coordination.  Abd: Soft, nontender and nondistended Pelvic: Normal appearing external genitalia; normal appearing vaginal mucosa and cervix.  Normal discharge. No blood in vault.  Small uterus, no other palpable masses  The indications for endometrial biopsy were reviewed.   Risks of the biopsy including cramping, bleeding, infection, uterine perforation, inadequate specimen and need for additional procedures  were discussed. The patient states she understands and agrees to undergo procedure today. Consent was signed. Time out was performed. Urine HCG was negative. A sterile speculum was placed in the patient's vagina and the cervix was prepped with Betadine. A single-toothed tenaculum was placed on the anterior lip of the cervix to stabilize it. The 3 mm pipelle was introduced into the endometrial cavity without difficulty to a depth of 7cm, and a moderate amount of tissue was obtained and sent to pathology. The instruments  were removed from the patient's vagina. Minimal bleeding from the cervix was noted. The patient tolerated the procedure well.    Labs and Imaging 02/24/2020 CLINICAL DATA:  Initial evaluation for abnormal uterine bleeding, perimenopausal bleeding.  EXAM: ULTRASOUND PELVIS TRANSVAGINAL  TECHNIQUE: Transvaginal ultrasound examination of the pelvis was performed including evaluation of the uterus, ovaries, adnexal regions, and pelvic cul-de-sac.  COMPARISON:  None available.  FINDINGS: Uterus  Measurements: 8.6 x 5.1 x 6.0 cm = volume: 136.8 mL. Uterus demonstrates a heterogeneous echotexture. Possible superimposed 8 mm lesion at the posterior uterine body could reflect a small intramural fibroid.  Endometrium  Thickness: 6.2 mm.  No focal abnormality visualized.  Right ovary  Not visualized.  No adnexal mass.  Left ovary  Measurements: 4.0 x 3.9 x 3.7 cm = volume: 30.8 mL. 3.6 x 2.4 x 2.8 cm complex cyst with low-level internal echoes. No internal vascularity or visible solid component. Adjacent 0.8 x 1.5 x 1.7 cm simple cyst present as well.  Other findings:  No abnormal free fluid  IMPRESSION: 1. Endometrial stripe measures 6.2 mm in thickness. If bleeding remains unresponsive to hormonal or medical therapy, sonohysterogram should be considered for focal lesion work-up. (Ref: Radiological Reasoning: Algorithmic Workup of Abnormal Vaginal Bleeding with Endovaginal Sonography and Sonohysterography. AJR [id number]). 2. 3.6 cm complex left ovarian cyst, indeterminate. Differential considerations include a hemorrhagic cyst or possibly endometrioma. Short interval follow-up ultrasound in 6-12 weeks suggested for further evaluation. 3. Additional 1.7 cm simple left ovarian cyst, most consistent with a benign follicular cyst. 4. Question 8 mm intramural fibroid at the posterior uterine body.  Assessment & Plan:  Perimenopausal AUB on Megace. S/p  iron infusion. Hgb is better  now. Still with almost daily bleeding.   F/u surg path  Pt needs f/u US for ov cyst  Patient desires surgical management with hysteroscopy with resection of fibroid using Myosure and endometrial ablation suing Novasure. The risks of surgery were discussed in detail with the patient including but not limited to: bleeding which may require transfusion or reoperation; infection which may require prolonged hospitalization or re-hospitalization and antibiotic therapy; injury to bowel, bladder, ureters and major vessels or other surrounding organs; need for additional procedures including laparotomy; thromboembolic phenomenon, incisional problems and other postoperative or anesthesia complications.  Patient was told that the likelihood that her condition and symptoms will be treated effectively with this surgical management was very high; the postoperative expectations were also discussed in detail. The patient also understands the alternative treatment options which were discussed in full. All questions were answered.  She was told that she will be contacted by our surgical scheduler regarding the time and date of her surgery; routine preoperative instructions of having nothing to eat or drink after midnight on the day prior to surgery and also coming to the hospital 1 1/2 hours prior to her time of surgery were also emphasized.  She was told she may be called for a preoperative appointment about a week prior to surgery and will be given further preoperative instructions at that visit. Printed patient education handouts about the procedure were given to the patient to review at home.  Tyhesha Dutson L. Harraway-Smith, M.D., Cherlynn June

## 2020-04-06 NOTE — Patient Instructions (Signed)
Endometrial Ablation  Endometrial ablation is a procedure that destroys the thin inner layer of the lining of the uterus (endometrium). This procedure may be done:  To stop heavy periods.  To stop bleeding that is causing anemia.  To control irregular bleeding.  To treat bleeding caused by small tumors (fibroids) in the endometrium.  This procedure is often an alternative to major surgery, such as removal of the uterus and cervix (hysterectomy). As a result of this procedure:  You may not be able to have children. However, if you are premenopausal (you have not gone through menopause):  You may still have a small chance of getting pregnant.  You will need to use a reliable method of birth control after the procedure to prevent pregnancy.  You may stop having a menstrual period, or you may have only a small amount of bleeding during your period. Menstruation may return several years after the procedure.  Tell a health care provider about:  Any allergies you have.  All medicines you are taking, including vitamins, herbs, eye drops, creams, and over-the-counter medicines.  Any problems you or family members have had with the use of anesthetic medicines.  Any blood disorders you have.  Any surgeries you have had.  Any medical conditions you have.  What are the risks?  Generally, this is a safe procedure. However, problems may occur, including:  A hole (perforation) in the uterus or bowel.  Infection of the uterus, bladder, or vagina.  Bleeding.  Damage to other structures or organs.  An air bubble in the lung (air embolus).  Problems with pregnancy after the procedure.  Failure of the procedure.  Decreased ability to diagnose cancer in the endometrium.  What happens before the procedure?  You will have tests of your endometrium to make sure there are no pre-cancerous cells or cancer cells present.  You may have an ultrasound of the uterus.  You may be given medicines to thin the endometrium.  Ask your health care  provider about:  Changing or stopping your regular medicines. This is especially important if you take diabetes medicines or blood thinners.  Taking medicines such as aspirin and ibuprofen. These medicines can thin your blood. Do not take these medicines before your procedure if your doctor tells you not to.  Plan to have someone take you home from the hospital or clinic.  What happens during the procedure?    You will lie on an exam table with your feet and legs supported as in a pelvic exam.  To lower your risk of infection:  Your health care team will wash or sanitize their hands and put on germ-free (sterile) gloves.  Your genital area will be washed with soap.  An IV tube will be inserted into one of your veins.  You will be given a medicine to help you relax (sedative).  A surgical instrument with a light and camera (resectoscope) will be inserted into your vagina and moved into your uterus. This allows your surgeon to see inside your uterus.  Endometrial tissue will be removed using one of the following methods:  Radiofrequency. This method uses a radiofrequency-alternating electric current to remove the endometrium.  Cryotherapy. This method uses extreme cold to freeze the endometrium.  Heated-free liquid. This method uses a heated saltwater (saline) solution to remove the endometrium.  Microwave. This method uses high-energy microwaves to heat up the endometrium and remove it.  Thermal balloon. This method involves inserting a catheter with a balloon tip into   the uterus. The balloon tip is filled with heated fluid to remove the endometrium.  The procedure may vary among health care providers and hospitals.  What happens after the procedure?  Your blood pressure, heart rate, breathing rate, and blood oxygen level will be monitored until the medicines you were given have worn off.  As tissue healing occurs, you may notice vaginal bleeding for 4-6 weeks after the procedure. You may also  experience:  Cramps.  Thin, watery vaginal discharge that is light pink or brown in color.  A need to urinate more frequently than usual.  Nausea.  Do not drive for 24 hours if you were given a sedative.  Do not have sex or insert anything into your vagina until your health care provider approves.  Summary  Endometrial ablation is done to treat the many causes of heavy menstrual bleeding.  The procedure may be done only after medications have been tried to control the bleeding.  Plan to have someone take you home from the hospital or clinic.  This information is not intended to replace advice given to you by your health care provider. Make sure you discuss any questions you have with your health care provider.  Document Revised: 04/08/2018 Document Reviewed: 11/08/2016  Elsevier Patient Education  2020 [employer]

## 2020-04-08 LAB — SURGICAL PATHOLOGY

## 2020-04-25 MED FILL — MEGESTROL 40 MG TABLET: 40 | 40 days supply | Qty: 80 | Fill #1

## 2020-04-26 ENCOUNTER — Ambulatory Visit (HOSPITAL_BASED_OUTPATIENT_CLINIC_OR_DEPARTMENT_OTHER): Payer: No Typology Code available for payment source

## 2020-04-26 ENCOUNTER — Other Ambulatory Visit: Payer: Self-pay

## 2020-04-26 ENCOUNTER — Ambulatory Visit (HOSPITAL_BASED_OUTPATIENT_CLINIC_OR_DEPARTMENT_OTHER)
Admission: RE | Admit: 2020-04-26 | Discharge: 2020-04-26 | Disposition: A | Discharge status: Home / Self Care | Payer: No Typology Code available for payment source | Source: Ambulatory Visit | Attending: Obstetrics & Gynecology | Admitting: Obstetrics & Gynecology

## 2020-04-26 DIAGNOSIS — Z01419 Encounter for gynecological examination (general) (routine) without abnormal findings: Secondary | ICD-10-CM

## 2020-04-26 DIAGNOSIS — Z1231 Encounter for screening mammogram for malignant neoplasm of breast: Secondary | ICD-10-CM | POA: Insufficient documentation

## 2020-04-29 ENCOUNTER — Ambulatory Visit (INDEPENDENT_AMBULATORY_CARE_PROVIDER_SITE_OTHER): Payer: No Typology Code available for payment source | Admitting: Family Medicine

## 2020-04-29 ENCOUNTER — Ambulatory Visit (HOSPITAL_BASED_OUTPATIENT_CLINIC_OR_DEPARTMENT_OTHER)
Admission: RE | Admit: 2020-04-29 | Discharge: 2020-04-29 | Disposition: A | Discharge status: Home / Self Care | Payer: No Typology Code available for payment source | Source: Ambulatory Visit | Attending: Obstetrics & Gynecology | Admitting: Obstetrics & Gynecology

## 2020-04-29 ENCOUNTER — Encounter: Payer: Self-pay | Admitting: Family Medicine

## 2020-04-29 ENCOUNTER — Other Ambulatory Visit: Payer: Self-pay | Admitting: Obstetrics & Gynecology

## 2020-04-29 ENCOUNTER — Other Ambulatory Visit: Payer: Self-pay

## 2020-04-29 VITALS — BP 98/57 | HR 68 | Temp 97.7°F | Resp 18 | Ht 68.0 in | Wt 168.4 lb

## 2020-04-29 DIAGNOSIS — Z0000 Encounter for general adult medical examination without abnormal findings: Secondary | ICD-10-CM | POA: Diagnosis not present

## 2020-04-29 DIAGNOSIS — R928 Other abnormal and inconclusive findings on diagnostic imaging of breast: Secondary | ICD-10-CM

## 2020-04-29 DIAGNOSIS — Z23 Encounter for immunization: Secondary | ICD-10-CM | POA: Diagnosis not present

## 2020-04-29 DIAGNOSIS — N83202 Unspecified ovarian cyst, left side: Secondary | ICD-10-CM | POA: Diagnosis not present

## 2020-04-29 DIAGNOSIS — Z1159 Encounter for screening for other viral diseases: Secondary | ICD-10-CM | POA: Diagnosis not present

## 2020-04-29 LAB — LIPID PANEL
Cholesterol: 135 mg/dL (ref 0–200)
HDL: 49.2 mg/dL (ref >39.00)
LDL Cholesterol: 70 mg/dL (ref 0–99)
NonHDL: 86.09
Total CHOL/HDL Ratio: 3
Triglycerides: 81 mg/dL (ref 0.0–149.0)
VLDL: 16.2 mg/dL (ref 0.0–40.0)

## 2020-04-29 LAB — CBC
HCT: 35.6 % — ABNORMAL LOW (ref 36.0–46.0)
Hemoglobin: 11.8 g/dL — ABNORMAL LOW (ref 12.0–15.0)
MCHC: 33.1 g/dL (ref 30.0–36.0)
MCV: 88.8 fl (ref 78.0–100.0)
Platelets: 284 10*3/uL (ref 150.0–400.0)
RBC: 4.01 Mil/uL (ref 3.87–5.11)
RDW: 20 % — ABNORMAL HIGH (ref 11.5–15.5)
WBC: 5.5 10*3/uL (ref 4.0–10.5)

## 2020-04-29 LAB — COMPREHENSIVE METABOLIC PANEL
ALT: 7 U/L (ref 0–35)
AST: 11 U/L (ref 0–37)
Albumin: 4.3 g/dL (ref 3.5–5.2)
Alkaline Phosphatase: 43 U/L (ref 39–117)
BUN: 13 mg/dL (ref 6–23)
CO2: 24 mEq/L (ref 19–32)
Calcium: 9.5 mg/dL (ref 8.4–10.5)
Chloride: 106 mEq/L (ref 96–112)
Creatinine, Ser: 0.91 mg/dL (ref 0.40–1.20)
GFR: 65.21 mL/min (ref >60.00)
Glucose, Bld: 92 mg/dL (ref 70–99)
Potassium: 4.1 mEq/L (ref 3.5–5.1)
Sodium: 138 mEq/L (ref 135–145)
Total Bilirubin: 0.2 mg/dL (ref 0.2–1.2)
Total Protein: 6.4 g/dL (ref 6.0–8.3)

## 2020-04-29 NOTE — Progress Notes (Signed)
Chief Complaint  Patient presents with  . Annual Exam    No concerns Non-fasting labs  . Health Maintenance    Looking to have her colonoscopy done in August      Well Woman Ann Owen is here for a complete physical.   Her last physical was >1 year ago.  Current diet: in general, a "fine" diet. Current exercise: cardio, wt resistance exercise, walking. Weight is stable and she denies fatigue out of ordinary. Seatbelt? Yes  Health Maintenance Pap/HPV- Yes Mammogram- Yes Colon cancer screening-Scheduled for Aug of this year.  Shingrix- No Tetanus- No Hep C screening- No HIV screening- Yes  Past Medical History:  Diagnosis Date  . Frequent headaches   . History of chicken pox      Past Surgical History:  Procedure Laterality Date  . NO PAST SURGERIES      Medications  Current Outpatient Medications on File Prior to Visit  Medication Sig Dispense Refill  . docusate sodium (COLACE) 100 MG capsule Take 100 mg by mouth 2 (two) times daily.    . ferrous sulfate 325 (65 FE) MG EC tablet Take 325 mg by mouth 3 (three) times daily with meals.    . megestrol (MEGACE) 40 MG tablet 1 tablet by mouth BID 80 tablet 1  . senna (SENOKOT) 8.6 MG TABS tablet Take 1 tablet by mouth daily.      Allergies No Known Allergies  Review of Systems: Constitutional:  no unexpected weight changes Eye:  no recent significant change in vision Ear/Nose/Mouth/Throat:  Ears:  no recent change in hearing Nose/Mouth/Throat:  no complaints of nasal congestion, no sore throat Cardiovascular: no chest pain Respiratory:  no shortness of breath Gastrointestinal:  no abdominal pain, no change in bowel habits GU:  Female: negative for dysuria or pelvic pain Musculoskeletal/Extremities:  no pain of the joints Integumentary (Skin/Breast):  no abnormal skin lesions reported Neurologic:  no headaches Endocrine:  denies fatigue  Exam BP (!) 98/57   Pulse 68   Temp 97.7 F (36.5 C) (Temporal)   Resp  18   Ht 5\' 8"  (1.727 m)   Wt 168 lb 6.4 oz (76.4 kg)   SpO2 100%   BMI 25.61 kg/m  General:  well developed, well nourished, in no apparent distress Skin:  no significant moles, warts, or growths Head:  no masses, lesions, or tenderness Eyes:  pupils equal and round, sclera anicteric without injection Ears:  canals without lesions, TMs shiny without retraction, no obvious effusion, no erythema Nose:  nares patent, septum midline, mucosa normal, and no drainage or sinus tenderness Throat/Pharynx:  lips and gingiva without lesion; tongue and uvula midline; non-inflamed pharynx; no exudates or postnasal drainage Neck: neck supple without adenopathy, thyromegaly, or masses Lungs:  clear to auscultation, breath sounds equal bilaterally, no respiratory distress Cardio:  regular rate and rhythm, no LE edema Abdomen:  abdomen soft, nontender; bowel sounds normal; no masses or organomegaly Genital: Defer to GYN Musculoskeletal:  symmetrical muscle groups noted without atrophy or deformity Extremities:  no clubbing, cyanosis, or edema, no deformities, no skin discoloration Neuro:  gait normal; deep tendon reflexes normal and symmetric Psych: well oriented with normal range of affect and appropriate judgment/insight  Assessment and Plan  Well adult exam - Plan: CBC, Comprehensive metabolic panel, Lipid panel  Encounter for hepatitis C screening test for low risk patient - Plan: Hepatitis C antibody   Well 49 y.o. female. Counseled on diet and exercise. Other orders as above. Follow up  in 1 yr or prn. The patient voiced understanding and agreement to the plan.  [place], DO 04/29/20 1:38 PM

## 2020-04-29 NOTE — Addendum Note (Signed)
Addended by: Thomes Cake on: 04/29/2020 01:48 PM   Modules accepted: Orders

## 2020-04-29 NOTE — Patient Instructions (Addendum)

## 2020-05-02 ENCOUNTER — Ambulatory Visit
Admission: RE | Admit: 2020-05-02 | Discharge: 2020-05-02 | Disposition: A | Discharge status: Home / Self Care | Payer: No Typology Code available for payment source | Source: Ambulatory Visit | Attending: Obstetrics & Gynecology | Admitting: Obstetrics & Gynecology

## 2020-05-02 ENCOUNTER — Other Ambulatory Visit: Payer: Self-pay

## 2020-05-02 DIAGNOSIS — R928 Other abnormal and inconclusive findings on diagnostic imaging of breast: Secondary | ICD-10-CM

## 2020-05-02 LAB — HEPATITIS C ANTIBODY
Hepatitis C Ab: NONREACTIVE
SIGNAL TO CUT-OFF: 0.1 (ref <1.00)

## 2020-05-13 ENCOUNTER — Other Ambulatory Visit: Payer: Self-pay

## 2020-05-13 ENCOUNTER — Encounter (HOSPITAL_BASED_OUTPATIENT_CLINIC_OR_DEPARTMENT_OTHER): Payer: Self-pay | Admitting: Obstetrics & Gynecology

## 2020-05-13 NOTE — Progress Notes (Addendum)
NEW Covid Policy July 6811  Surgery Day:  05-24-20  Facility:  [facility]  Type of Surgery:D AND C HYSTEROSCOPY  Fully Covid Vaccinated:   1)10-28-2019                                          2) 11-20-2019                                          Where?[place]                                            Type?PFIZER Not Covid Vaccinated:  N/A  Do you have symptoms?NO  In the past 14 days:        Have you had any symptoms? NO       Have you been tested covid positive? NO       Have you been in contact with someone covid positive? NO        Is pt Immuno-compromised? NO

## 2020-05-13 NOTE — Progress Notes (Addendum)
Spoke w/ via phone for pre-op interview---PT Lab needs dos----  NONE             COVID test ------NOT [facility] at -------1400 PM 05-24-2020 NO FOOD AFTER MIDNIGHT CLEAR LIQUIDS FROM MIDNIGHT UNTIL 1300 THEN NPO Medications to take morning of surgery -----NONE Diabetic medication -----N/A Patient Special Instructions -----PT AWARE TO BRING COVID VACCINATION CARD DAY OF SURGERY Pre-Op special Istructions -----NONE Patient verbalized understanding of instructions that were given at this phone interview. Patient denies shortness of breath, chest pain, fever, cough a this phone interview.

## 2020-05-23 ENCOUNTER — Other Ambulatory Visit (HOSPITAL_COMMUNITY)
Admission: RE | Admit: 2020-05-23 | Discharge: 2020-05-23 | Disposition: A | Discharge status: Home / Self Care | Payer: No Typology Code available for payment source | Source: Ambulatory Visit | Attending: Obstetrics & Gynecology | Admitting: Obstetrics & Gynecology

## 2020-05-23 DIAGNOSIS — Z01812 Encounter for preprocedural laboratory examination: Secondary | ICD-10-CM | POA: Diagnosis not present

## 2020-05-23 DIAGNOSIS — Z20822 Contact with and (suspected) exposure to covid-19: Secondary | ICD-10-CM | POA: Diagnosis not present

## 2020-05-23 LAB — SARS CORONAVIRUS 2 (TAT 6-24 HRS): SARS Coronavirus 2: NEGATIVE

## 2020-05-24 ENCOUNTER — Ambulatory Visit (HOSPITAL_BASED_OUTPATIENT_CLINIC_OR_DEPARTMENT_OTHER)
Admission: RE | Admit: 2020-05-24 | Discharge: 2020-05-24 | Disposition: A | Discharge status: Home / Self Care | Payer: No Typology Code available for payment source | Attending: Obstetrics & Gynecology | Admitting: Obstetrics & Gynecology

## 2020-05-24 ENCOUNTER — Ambulatory Visit (HOSPITAL_BASED_OUTPATIENT_CLINIC_OR_DEPARTMENT_OTHER): Payer: No Typology Code available for payment source | Admitting: Anesthesiology

## 2020-05-24 ENCOUNTER — Encounter (HOSPITAL_BASED_OUTPATIENT_CLINIC_OR_DEPARTMENT_OTHER)
Admission: RE | Disposition: A | Discharge status: Home / Self Care | Payer: Self-pay | Source: Home / Self Care | Attending: Obstetrics & Gynecology

## 2020-05-24 ENCOUNTER — Other Ambulatory Visit: Payer: Self-pay

## 2020-05-24 ENCOUNTER — Encounter (HOSPITAL_BASED_OUTPATIENT_CLINIC_OR_DEPARTMENT_OTHER): Payer: Self-pay | Admitting: Obstetrics & Gynecology

## 2020-05-24 DIAGNOSIS — N83202 Unspecified ovarian cyst, left side: Secondary | ICD-10-CM | POA: Diagnosis not present

## 2020-05-24 DIAGNOSIS — N924 Excessive bleeding in the premenopausal period: Secondary | ICD-10-CM

## 2020-05-24 DIAGNOSIS — Z79899 Other long term (current) drug therapy: Secondary | ICD-10-CM | POA: Insufficient documentation

## 2020-05-24 DIAGNOSIS — Z20822 Contact with and (suspected) exposure to covid-19: Secondary | ICD-10-CM | POA: Insufficient documentation

## 2020-05-24 DIAGNOSIS — D649 Anemia, unspecified: Secondary | ICD-10-CM | POA: Insufficient documentation

## 2020-05-24 HISTORY — DX: Excessive bleeding in the premenopausal period: N92.4

## 2020-05-24 HISTORY — DX: Anemia, unspecified: D64.9

## 2020-05-24 HISTORY — PX: DILITATION & CURRETTAGE/HYSTROSCOPY WITH NOVASURE ABLATION: SHX5568

## 2020-05-24 LAB — CBC
HCT: 38.2 % (ref 36.0–46.0)
Hemoglobin: 12.2 g/dL (ref 12.0–15.0)
MCH: 31 pg (ref 26.0–34.0)
MCHC: 31.9 g/dL (ref 30.0–36.0)
MCV: 97.2 fL (ref 80.0–100.0)
Platelets: 262 10*3/uL (ref 150–400)
RBC: 3.93 MIL/uL (ref 3.87–5.11)
RDW: 15.9 % — ABNORMAL HIGH (ref 11.5–15.5)
WBC: 5.8 10*3/uL (ref 4.0–10.5)
nRBC: 0 % (ref 0.0–0.2)

## 2020-05-24 LAB — POCT PREGNANCY, URINE: Preg Test, Ur: NEGATIVE

## 2020-05-24 SURGERY — DILATATION & CURETTAGE/HYSTEROSCOPY WITH NOVASURE ABLATION
Anesthesia: General

## 2020-05-24 MED ORDER — POVIDONE-IODINE 10 % EX SWAB: 2.0000 "application " | Freq: Once | CUTANEOUS | Status: DC

## 2020-05-24 MED ORDER — DEXAMETHASONE SODIUM PHOSPHATE 10 MG/ML IJ SOLN
INTRAMUSCULAR | Status: AC
  Filled 2020-05-24: qty 1

## 2020-05-24 MED ORDER — DEXAMETHASONE SODIUM PHOSPHATE 10 MG/ML IJ SOLN
INTRAMUSCULAR | Status: DC | PRN
  Administered 2020-05-24 (×2): 5 mg via INTRAVENOUS

## 2020-05-24 MED ORDER — DEXTROSE-NACL 5-0.45 % IV SOLN: INTRAVENOUS | Status: DC

## 2020-05-24 MED ORDER — IBUPROFEN 800 MG PO TABS: 800.0000 mg | ORAL_TABLET | Freq: Three times a day (TID) | ORAL | 0 refills | Status: DC | PRN

## 2020-05-24 MED ORDER — MIDAZOLAM HCL 2 MG/2ML IJ SOLN
INTRAMUSCULAR | Status: AC
  Filled 2020-05-24: qty 2

## 2020-05-24 MED ORDER — SCOPOLAMINE 1 MG/3DAYS TD PT72
1.0000 | MEDICATED_PATCH | TRANSDERMAL | Status: DC
  Administered 2020-05-24: 1.5 mg via TRANSDERMAL

## 2020-05-24 MED ORDER — BUPIVACAINE HCL (PF) 0.5 % IJ SOLN
INTRAMUSCULAR | Status: DC | PRN
  Administered 2020-05-24: 20 mL

## 2020-05-24 MED ORDER — LACTATED RINGERS IV SOLN: INTRAVENOUS | Status: DC

## 2020-05-24 MED ORDER — PROPOFOL 10 MG/ML IV BOLUS
INTRAVENOUS | Status: DC | PRN
  Administered 2020-05-24: 150 mg via INTRAVENOUS

## 2020-05-24 MED ORDER — SODIUM CHLORIDE 0.9 % IR SOLN
Status: DC | PRN
  Administered 2020-05-24: 3000 mL

## 2020-05-24 MED ORDER — OXYCODONE HCL 5 MG PO TABS
5.0000 mg | ORAL_TABLET | Freq: Once | ORAL | Status: AC
  Administered 2020-05-24: 5 mg via ORAL

## 2020-05-24 MED ORDER — FENTANYL CITRATE (PF) 100 MCG/2ML IJ SOLN
INTRAMUSCULAR | Status: DC | PRN
  Administered 2020-05-24 (×2): 25 ug via INTRAVENOUS
  Administered 2020-05-24: 50 ug via INTRAVENOUS

## 2020-05-24 MED ORDER — SCOPOLAMINE 1 MG/3DAYS TD PT72
MEDICATED_PATCH | TRANSDERMAL | Status: AC
  Filled 2020-05-24: qty 1

## 2020-05-24 MED ORDER — ONDANSETRON HCL 4 MG/2ML IJ SOLN
INTRAMUSCULAR | Status: DC | PRN
  Administered 2020-05-24: 4 mg via INTRAVENOUS

## 2020-05-24 MED ORDER — SOD CITRATE-CITRIC ACID 500-334 MG/5ML PO SOLN: 30.0000 mL | ORAL | Status: DC

## 2020-05-24 MED ORDER — LIDOCAINE 2% (20 MG/ML) 5 ML SYRINGE
INTRAMUSCULAR | Status: DC | PRN
  Administered 2020-05-24: 100 mg via INTRAVENOUS

## 2020-05-24 MED ORDER — ONDANSETRON HCL 4 MG/2ML IJ SOLN
INTRAMUSCULAR | Status: AC
  Filled 2020-05-24: qty 2

## 2020-05-24 MED ORDER — LIDOCAINE 2% (20 MG/ML) 5 ML SYRINGE
INTRAMUSCULAR | Status: AC
  Filled 2020-05-24: qty 5

## 2020-05-24 MED ORDER — KETOROLAC TROMETHAMINE 30 MG/ML IJ SOLN
INTRAMUSCULAR | Status: DC | PRN
Start: 2020-05-24 — End: 2020-05-24
  Administered 2020-05-24: 30 mg via INTRAVENOUS

## 2020-05-24 MED ORDER — PROPOFOL 10 MG/ML IV BOLUS
INTRAVENOUS | Status: AC
  Filled 2020-05-24: qty 20

## 2020-05-24 MED ORDER — FENTANYL CITRATE (PF) 100 MCG/2ML IJ SOLN
INTRAMUSCULAR | Status: AC
  Filled 2020-05-24: qty 2

## 2020-05-24 MED ORDER — MIDAZOLAM HCL 2 MG/2ML IJ SOLN
INTRAMUSCULAR | Status: DC | PRN
  Administered 2020-05-24: 2 mg via INTRAVENOUS

## 2020-05-24 MED ORDER — OXYCODONE HCL 5 MG PO TABS
ORAL_TABLET | ORAL | Status: AC
  Filled 2020-05-24: qty 1

## 2020-05-24 MED ORDER — KETOROLAC TROMETHAMINE 30 MG/ML IJ SOLN
INTRAMUSCULAR | Status: AC
  Filled 2020-05-24: qty 1

## 2020-05-24 MED FILL — IBUPROFEN 800 MG TAB: 800 | 10 days supply | Qty: 30 | Fill #0

## 2020-05-24 SURGICAL SUPPLY — 17 items
ABLATOR SURESOUND NOVASURE (ABLATOR) ×3 IMPLANT
CATH ROBINSON RED A/P 16FR (CATHETERS) ×3 IMPLANT
COVER WAND RF STERILE (DRAPES) ×3 IMPLANT
DEVICE MYOSURE REACH (MISCELLANEOUS) ×2 IMPLANT
GAUZE 4X4 16PLY RFD (DISPOSABLE) ×3 IMPLANT
GLOVE BIO SURGEON STRL SZ7 (GLOVE) ×3 IMPLANT
GLOVE BIOGEL PI IND STRL 6.5 (GLOVE) IMPLANT
GLOVE BIOGEL PI IND STRL 7.0 (GLOVE) ×1 IMPLANT
GLOVE BIOGEL PI INDICATOR 6.5 (GLOVE) ×2
GLOVE BIOGEL PI INDICATOR 7.0 (GLOVE) ×4
GLOVE ECLIPSE 6.5 STRL STRAW (GLOVE) ×2 IMPLANT
GOWN STRL REUS W/TWL LRG LVL3 (GOWN DISPOSABLE) ×5 IMPLANT
KIT PROCEDURE FLUENT (KITS) ×3 IMPLANT
PACK VAGINAL MINOR WOMEN LF (CUSTOM PROCEDURE TRAY) ×3 IMPLANT
PAD OB MATERNITY 4.3X12.25 (PERSONAL CARE ITEMS) ×3 IMPLANT
SEAL ROD LENS SCOPE MYOSURE (ABLATOR) ×2 IMPLANT
TOWEL OR 17X26 10 PK STRL BLUE (TOWEL DISPOSABLE) ×3 IMPLANT

## 2020-05-24 NOTE — Brief Op Note (Signed)
05/24/2020  4:54 PM  PATIENT:  Ann Owen  49 y.o. female  PRE-OPERATIVE DIAGNOSIS:  PERIMENOPAUSAL AUB  POST-OPERATIVE DIAGNOSIS:  PERIMENOPAUSAL AUB  PROCEDURE:  Procedure(s): HYSTEROSCOPY WITH NOVASURE ABLATION AND RESECTION OF FIBROID WITH MYOSURE (N/A)  SURGEON:  Surgeon(s) and Role:    * Lavonia Drafts, MD - Primary  ANESTHESIA:   general and paracervical block  EBL:  5 mL   BLOOD ADMINISTERED:none  DRAINS: none   LOCAL MEDICATIONS USED:  MARCAINE     SPECIMEN:  Source of Specimen:  endometrial currettings.   DISPOSITION OF SPECIMEN:  PATHOLOGY  COUNTS:  YES  TOURNIQUET:  * No tourniquets in log *  DICTATION: .Note written in [place]: Discharge to home after PACU  PATIENT DISPOSITION:  PACU - hemodynamically stable.   Delay start of Pharmacological VTE agent (>24hrs) due to surgical blood loss or risk of bleeding: not applicable  Complications: none immediate.   Lavon Bothwell L. Harraway-Smith, M.D., [facility]

## 2020-05-24 NOTE — Anesthesia Procedure Notes (Signed)
Procedure Name: LMA Insertion Date/Time: 05/24/2020 3:58 PM Performed by: Suan Halter, CRNA Pre-anesthesia Checklist: Patient identified, Emergency Drugs available, Suction available and Patient being monitored Patient Re-evaluated:Patient Re-evaluated prior to induction Oxygen Delivery Method: Circle system utilized Preoxygenation: Pre-oxygenation with 100% oxygen Induction Type: IV induction Ventilation: Mask ventilation without difficulty LMA: LMA inserted LMA Size: 4.0 Number of attempts: 1 Airway Equipment and Method: Bite block Placement Confirmation: positive ETCO2 Tube secured with: Tape Dental Injury: Teeth and Oropharynx as per pre-operative assessment

## 2020-05-24 NOTE — Transfer of Care (Signed)
Immediate Anesthesia Transfer of Care Note  Patient: Ann Owen  Procedure(s) Performed: Procedure(s) (LRB): HYSTEROSCOPY WITH NOVASURE ABLATION AND RESECTION OF FIBROID WITH MYOSURE (N/A)  Patient Location: PACU  Anesthesia Type: General  Level of Consciousness: awake, oriented, sedated and patient cooperative  Airway & Oxygen Therapy: Patient Spontanous Breathing and Patient connected to face mask oxygen  Post-op Assessment: Report given to PACU RN and Post -op Vital signs reviewed and stable  Post vital signs: Reviewed and stable  Complications: No apparent anesthesia complications Last Vitals:  Vitals Value Taken Time  BP 121/74 05/24/20 1645  Temp    Pulse 57 05/24/20 1651  Resp 21 05/24/20 1651  SpO2 99 % 05/24/20 1651  Vitals shown include unvalidated device data.  Last Pain:  Vitals:   05/24/20 1440  TempSrc: Oral         Complications: No complications documented.

## 2020-05-24 NOTE — H&P (Signed)
Preoperative History and Physical  Ann Owen is a 49 y.o. [id number] here for surgical management of AUB/ perimenopausal bleeding.   Proposed surgery: hysteroscopy with resection of fibroids or polyps and endometrial ablation.  Past Medical History:  Diagnosis Date  . Abnormal perimenopausal bleeding    Dec 29, 2019 STARTED  . Anemia   . Frequent headaches    TENSION  . History of chicken pox AS CHILD   Past Surgical History:  Procedure Laterality Date  . NO PAST SURGERIES     OB History    Gravida  2   Para  2   Term  2   Preterm      AB      Living  2     SAB      TAB      Ectopic      Multiple      Live Births  2          Patient denies any cervical dysplasia or STIs. Medications Prior to Admission  Medication Sig Dispense Refill Last Dose  . docusate sodium (COLACE) 100 MG capsule Take 100 mg by mouth 2 (two) times daily.   05/23/2020 at Unknown time  . ferrous sulfate 325 (65 FE) MG EC tablet Take 325 mg by mouth 2 (two) times daily.    05/23/2020 at Unknown time  . megestrol (MEGACE) 40 MG tablet 1 tablet by mouth BID 80 tablet 1 05/23/2020 at Unknown time  . senna (SENOKOT) 8.6 MG TABS tablet Take 1 tablet by mouth daily.    05/23/2020 at Unknown time    No Known Allergies Social History:   reports that she has never smoked. She has never used smokeless tobacco. She reports that she does not drink alcohol and does not use drugs. Family History  Problem Relation Age of Onset  . Hyperlipidemia Mother   . Arthritis Father   . Cancer Maternal Grandmother        Bone    Review of Systems: Noncontributory  PHYSICAL EXAM: Blood pressure 126/81, pulse 61, temperature 98.8 F (37.1 C), temperature source Oral, resp. rate 18, height 5\' 8"  (1.727 m), weight 76.5 kg, last menstrual period 05/06/2020, SpO2 100 %. General appearance - alert, well appearing, and in no distress Chest - clear to auscultation, no wheezes, rales or rhonchi, symmetric air  entry Heart - normal rate and regular rhythm Abdomen - soft, nontender, nondistended, no masses or organomegaly Pelvic - examination not indicated Extremities - peripheral pulses normal, no pedal edema, no clubbing or cyanosis  Labs: Results for orders placed or performed during the hospital encounter of 05/24/20 (from the past 336 hour(s))  Pregnancy, urine POC   Collection Time: 05/24/20  2:38 PM  Result Value Ref Range   Preg Test, Ur NEGATIVE NEGATIVE  CBC   Collection Time: 05/24/20  3:00 PM  Result Value Ref Range   WBC 5.8 4.0 - 10.5 K/uL   RBC 3.93 3.87 - 5.11 MIL/uL   Hemoglobin 12.2 12.0 - 15.0 g/dL   HCT 38.2 36 - 46 %   MCV 97.2 80.0 - 100.0 fL   MCH 31.0 26.0 - 34.0 pg   MCHC 31.9 30.0 - 36.0 g/dL   RDW 15.9 (H) 11.5 - 15.5 %   Platelets 262 150 - 400 K/uL   nRBC 0.0 0.0 - 0.2 %  Results for orders placed or performed during the hospital encounter of 05/23/20 (from the past 336 hour(s))  SARS CORONAVIRUS 2 (  TAT 6-24 HRS) Nasopharyngeal Nasopharyngeal Swab   Collection Time: 05/23/20  3:20 PM   Specimen: Nasopharyngeal Swab  Result Value Ref Range   SARS Coronavirus 2 NEGATIVE NEGATIVE    Imaging Studies: US PELVIS TRANSVAGINAL NON-OB (TV ONLY)  Result Date: 04/30/2020 CLINICAL DATA:  Cyst follow-up. EXAM: ULTRASOUND PELVIS TRANSVAGINAL TECHNIQUE: Transvaginal ultrasound examination of the pelvis was performed including evaluation of the uterus, ovaries, adnexal regions, and pelvic cul-de-sac. COMPARISON:  02/24/2020 FINDINGS: Uterus Measurements: 8.7 x 5.5 x 6.4 cm. The uterus is somewhat difficult to penetrate. Endometrium Thickness: 7 mm.  No focal abnormality visualized. Right ovary Measurements: 1.7 x 1.8 x 1.9 cm. Normal appearance/no adnexal mass. Left ovary Measurements: 3.2 x 3.1 x 2.7 cm. Again noted is a slightly complex cystic mass measuring approximately 2.6 x 2 x 2.5 cm. This mass demonstrates low level internal echoes a no significant internal color  Doppler flow. Other findings:  No abnormal free fluid IMPRESSION: 1. Interval decrease in size of a complex left ovarian cystic mass. This is favored to represent an endometrioma or hemorrhagic cyst. 2. The endometrial stripe measures 7 mm in thickness, stable from prior study. Electronically Signed   By: Constance Holster M.D.   On: 04/30/2020 20:29   US BREAST LTD UNI LEFT INC AXILLA  Result Date: 05/02/2020 CLINICAL DATA:  49 year old female recalled from screening mammogram dated 04/26/2020 for a left breast mass. Patient states this area was palpable but has come down to about 1/3 of its original size in the interim. EXAM: ULTRASOUND OF THE LEFT BREAST COMPARISON:  Previous exam(s). FINDINGS: Targeted ultrasound is performed, showing an oval, circumscribed anechoic mass with internal mobile, layering debris at the 2:30 position 2 cm from the nipple. It measures 2.1 x 2.0 x 1.3 cm. There is no internal vascularity. This correlates well with the mammographic finding in the patient's palpable lump. IMPRESSION: Benign left breast cyst corresponding with the mammographic findings. No further imaging follow-up required. RECOMMENDATION: Screening mammogram in one year.(Code:SM-B-01Y) I have discussed the findings and recommendations with the patient. If applicable, a reminder letter will be sent to the patient regarding the next appointment. BI-RADS CATEGORY  2: Benign. Electronically Signed   By: Kristopher Oppenheim M.D.   On: 05/02/2020 11:40   MM 3D SCREEN BREAST BILATERAL  Result Date: 04/28/2020 CLINICAL DATA:  Screening. EXAM: DIGITAL SCREENING BILATERAL MAMMOGRAM WITH TOMO AND CAD COMPARISON:  Previous exam(s). ACR Breast Density Category c: The breast tissue is heterogeneously dense, which may obscure small masses. FINDINGS: In the left breast, a possible mass warrants further evaluation. In the right breast, no findings suspicious for malignancy. Images were processed with CAD. IMPRESSION: Further  evaluation is suggested for possible mass in the left breast. RECOMMENDATION: Ultrasound of the left breast. (Code:US-L-13M) The patient will be contacted regarding the findings, and additional imaging will be scheduled. BI-RADS CATEGORY  0: Incomplete. Need additional imaging evaluation and/or prior mammograms for comparison. Electronically Signed   By: Lajean Manes M.D.   On: 04/28/2020 13:53    Assessment: Patient Active Problem List   Diagnosis Date Noted  . Abnormal perimenopausal bleeding 05/24/2020  . IDA (iron deficiency anemia) 02/29/2020    Plan: Patient will undergo surgical management with hysteroscopy with resection of fibroids or polyps and endometrial ablation.   The risks of surgery were discussed in detail with the patient including but not limited to: bleeding which may require transfusion or reoperation; infection which may require antibiotics; injury to surrounding organs which may  involve bowel, bladder, ureters ; need for additional procedures including laparoscopy or laparotomy; thromboembolic phenomenon, surgical site problems and other postoperative/anesthesia complications. Likelihood of success in alleviating the patient's condition was discussed. Routine postoperative instructions will be reviewed with the patient and her family in detail after surgery.  The patient concurred with the proposed plan, giving informed written consent for the surgery.  Patient has been NPO since last night she will remain NPO for procedure.  Anesthesia and OR aware.  Preoperative prophylactic antibiotics and SCDs ordered on call to the OR.  To OR when ready.  Jawara Latorre L. Ihor Dow, M.D., [facility] 05/24/2020 3:50 PM

## 2020-05-24 NOTE — Anesthesia Preprocedure Evaluation (Addendum)
Anesthesia Evaluation  Patient identified by MRN, date of birth, ID band Patient awake    Reviewed: Allergy & Precautions, NPO status , Patient's Chart, lab work & pertinent test results  Airway Mallampati: II  TM Distance: >3 FB Neck ROM: Full    Dental no notable dental hx. (+) Teeth Intact, Dental Advisory Given   Pulmonary neg pulmonary ROS,    Pulmonary exam normal breath sounds clear to auscultation       Cardiovascular Exercise Tolerance: Good negative cardio ROS Normal cardiovascular exam Rhythm:Regular Rate:Normal     Neuro/Psych  Headaches, negative psych ROS   GI/Hepatic negative GI ROS, Neg liver ROS,   Endo/Other  negative endocrine ROS  Renal/GU negative Renal ROS     Musculoskeletal negative musculoskeletal ROS (+)   Abdominal   Peds  Hematology  (+) anemia ,   Anesthesia Other Findings   Reproductive/Obstetrics                            Anesthesia Physical Anesthesia Plan  ASA: II  Anesthesia Plan: General   Post-op Pain Management:    Induction: Intravenous  PONV Risk Score and Plan: 3 and Treatment may vary due to age or medical condition, Ondansetron and Dexamethasone  Airway Management Planned: LMA  Additional Equipment: None  Intra-op Plan:   Post-operative Plan:   Informed Consent: I have reviewed the patients History and Physical, chart, labs and discussed the procedure including the risks, benefits and alternatives for the proposed anesthesia with the patient or authorized representative who has indicated his/her understanding and acceptance.     Dental advisory given  Plan Discussed with:   Anesthesia Plan Comments:        Anesthesia Quick Evaluation

## 2020-05-24 NOTE — Op Note (Signed)
05/24/2020  4:54 PM  PATIENT:  Ann Owen  49 y.o. female  PRE-OPERATIVE DIAGNOSIS:  PERIMENOPAUSAL AUB  POST-OPERATIVE DIAGNOSIS:  PERIMENOPAUSAL AUB  PROCEDURE:  Procedure(s): HYSTEROSCOPY WITH NOVASURE ABLATION AND RESECTION OF FIBROID WITH MYOSURE (N/A)  SURGEON:  Surgeon(s) and Role:    * Lavonia Drafts, MD - Primary  ANESTHESIA:   general and paracervical block  EBL:  5 mL   BLOOD ADMINISTERED:none  DRAINS: none   LOCAL MEDICATIONS USED:  MARCAINE     SPECIMEN:  Source of Specimen:  endometrial currettings.   DISPOSITION OF SPECIMEN:  PATHOLOGY  COUNTS:  YES  TOURNIQUET:  * No tourniquets in log *  DICTATION: .Note written in [place]: Discharge to home after PACU  PATIENT DISPOSITION:  PACU - hemodynamically stable.   Delay start of Pharmacological VTE agent (>24hrs) due to surgical blood loss or risk of bleeding: not applicable  Complications: none immediate.   Indications: perimenopausal bleeding not responsive to conservative measures. Requests operative conservative management.   Findings: thickened endometrium, posterior fibroid. Ostia noted bilaterally.    PROCEDURE DETAILS:  The patient was taken to the operating room where general anesthesia was administered and was found to be adequate.  After an adequate timeout was performed, she was placed in the dorsal lithotomy position and examined; then prepped and draped in the sterile manner.   Her bladder was catheterized for an unmeasured amount of clear, yellow urine. A speculum was then placed in the patient's vagina and a single tooth tenaculum was applied to the anterior lip of the cervix.   A paracervical block using 20 ml of 0.5% Marcaine was administered. The endometrial cavity was sounded to 12cm and the endocervical length measured 4cm.  The cervix was dilated manually with metal dilators to accommodate the Myosure operative hysteroscope.  Once the cervix was dilated, the  hysteroscope was inserted under direct visualization using LR as a suspension medium.  The uterine cavity was carefully examined, both ostia were recognized, and diffusely proliferative endometrium was noted. A sharp curettage was then performed to obtain a moderate amount of endometrial curettings. Specimens were sent to pathology. Following this, a posterior fibroid was noted. This was resected using the Myosure device.  After further careful visualization of the uterine cavity, the hysteroscope was removed under direct visualization. The NovaSure device was then inserted and seated using 6.0 cm as the cavity length and 4.7cm as the cavity width.  The total activation time was 40 sec at a power of 168w.  The hysteroscope was reinserted and an even burn pattern was noted to the fundus.  The single toothed tenaculum was removed at the end of the case and no bleeding was noted from the cervix.   The patient was extubated and taken to the recovery room in stable condition.  Sponge, lap and instrument counts were correct.  There were no complications.  [name], M.D., [place]lynn June

## 2020-05-24 NOTE — Discharge Instructions (Signed)
Endometrial Ablation, Care After This sheet gives you information about how to care for yourself after your procedure. Your health care provider may also give you more specific instructions. If you have problems or questions, contact your health care provider. What can I expect after the procedure? After the procedure, it is common to have:  A need to urinate more frequently than usual for the first 24 hours.  Cramps similar to menstrual cramps. These may last for 1-2 days.  Thin, watery vaginal discharge that is light pink or brown in color. This may last a few weeks. Discharge will be heavy for the first few days after your procedure. You may need to wear a sanitary pad.  Nausea.  Vaginal bleeding for 4-6 weeks after the procedure, as tissue healing occurs. Follow these instructions at home: Activity  Do not drive for 24 hours if you were given amedicine to help you relax (sedative) during your procedure.  Do not have sex or put anything into your vagina until your health care provider approves.  Do not lift anything that is heavier than 10 lb (4.5 kg), or the limit that you are told, until your health care provider says that it is safe.  Return to your normal activities as told by your health care provider. Ask your health care provider what activities are safe for you. General instructions   Take over-the-counter and prescription medicines only as told by your health care provider.  Do not take baths, swim, or use a hot tub until your health care provider approves. You will be able to take showers.  Check your vaginal area every day for signs of infection. Check for: ? Redness, swelling, or pain. ? More discharge or blood, instead of less. ? Bad-smelling discharge.  Keep all follow-up visits as told by your health care provider. This is important.  Drink enough fluid to keep your urine pale yellow. Contact a health care provider if you have:  Vaginal redness, swelling, or  pain.  Vaginal discharge or bleeding that gets worse instead of getting better.  Bad-smelling vaginal discharge.  A fever or chills.  Trouble urinating. Get help right away if you have:  Heavy vaginal bleeding.  Severe cramps. Summary  After endometrial ablation, it is normal to have thin, watery vaginal discharge that is light pink or brown in color. This may last a few weeks and may be heavier right after the procedure.  Vaginal bleeding is also normal after the procedure and should get better with time.  Check your vaginal area every day for signs of infection, such as bad-smelling discharge.  Keep all follow-up visits as told by your health care provider. This is important. This information is not intended to replace advice given to you by your health care provider. Make sure you discuss any questions you have with your health care provider. Document Revised: 02/12/2019 Document Reviewed: 09/03/2017 Elsevier Patient Education  2020 Else[employer] Inc[employer]   Post Anesthesia Home Care Instructions  Activity: Get plenty of rest for the remainder of the day. A responsible adult should stay with you for 24 hours following the procedure.  For the next 24 hours, DO NOT: -Drive a car -Operate machinery -Drink alcoholic beverages -Take any medication unless instructed by your physician -Make any legal decisions or sign important papers.  Meals: Start with liquid foods such as gelatin or soup. Progress to regular foods as tolerated. Avoid greasy, spicy, heavy foods. If nausea and/or vomiting occur, drink only clear liquids until the   nausea and/or vomiting subsides. Call your physician if vomiting continues.  Special Instructions/Symptoms: Your throat may feel dry or sore from the anesthesia or the breathing tube placed in your throat during surgery. If this causes discomfort, gargle with warm salt water. The discomfort should disappear within 24 hours.  If you had a scopolamine patch  placed behind your ear for the management of post- operative nausea and/or vomiting:  1. The medication in the patch is effective for 72 hours, after which it should be removed.  Wrap patch in a tissue and discard in the trash. Wash hands thoroughly with soap and water. 2. You may remove the patch earlier than 72 hours if you experience unpleasant side effects which may include dry mouth, dizziness or visual disturbances. 3. Avoid touching the patch. Wash your hands with soap and water after contact with the patch.    

## 2020-05-24 NOTE — Anesthesia Postprocedure Evaluation (Signed)
Anesthesia Post Note  Patient: Aquita Simmering  Procedure(s) Performed: HYSTEROSCOPY WITH NOVASURE ABLATION AND RESECTION OF FIBROID WITH MYOSURE (N/A )     Patient location during evaluation: PACU Anesthesia Type: General Level of consciousness: awake and alert Pain management: pain level controlled Vital Signs Assessment: post-procedure vital signs reviewed and stable Respiratory status: spontaneous breathing, nonlabored ventilation, respiratory function stable and patient connected to nasal cannula oxygen Cardiovascular status: blood pressure returned to baseline and stable Postop Assessment: no apparent nausea or vomiting Anesthetic complications: no   No complications documented.  Last Vitals:  Vitals:   05/24/20 1700 05/24/20 1730  BP: 118/72 122/76  Pulse: (!) 59 70  Resp: 20 18  Temp:  36.7 C  SpO2: 99% 98%    Last Pain:  Vitals:   05/24/20 1440  TempSrc: Oral                 Isayah Ignasiak S

## 2020-05-25 ENCOUNTER — Encounter (HOSPITAL_BASED_OUTPATIENT_CLINIC_OR_DEPARTMENT_OTHER): Payer: Self-pay | Admitting: Obstetrics & Gynecology

## 2020-05-26 LAB — SURGICAL PATHOLOGY

## 2020-05-27 MED FILL — FEOSOL BIFERA 28 MG TABS: 28 | 60 days supply | Qty: 60 | Fill #2

## 2020-06-15 ENCOUNTER — Ambulatory Visit (INDEPENDENT_AMBULATORY_CARE_PROVIDER_SITE_OTHER): Payer: No Typology Code available for payment source | Admitting: Obstetrics & Gynecology

## 2020-06-15 ENCOUNTER — Encounter: Payer: Self-pay | Admitting: Obstetrics & Gynecology

## 2020-06-15 ENCOUNTER — Other Ambulatory Visit: Payer: Self-pay

## 2020-06-15 VITALS — BP 104/72 | HR 64 | Ht 68.0 in | Wt 170.0 lb

## 2020-06-15 DIAGNOSIS — Z9889 Other specified postprocedural states: Secondary | ICD-10-CM

## 2020-06-15 NOTE — Progress Notes (Signed)
Pt states she is having some light bleeding using 1-2 pads daily. Latasha Puskas l Simran Bomkamp, CMA

## 2020-06-15 NOTE — Progress Notes (Signed)
History:  49 y.o.LMP here today for 2 week post op check.Pt is s/p hysteroscopy with endometrial ablation on 05/24/2020. Pt reports that she is doing well. She is eating and passing stols without difficulty.   She has light bleeding requiring about 2 pads per day. No other sx.   The following portions of the patient's history were reviewed and updated as appropriate: allergies, current medications, past family history, past medical history, past social history, past surgical history and problem list.  Review of Systems:  Pertinent items are noted in HPI.    Objective:  Physical Exam BP 104/72   Pulse 64   Ht 5\' 8"  (1.727 m)   Wt 170 lb (77.1 kg)   BMI 25.85 kg/m   CONSTITUTIONAL: Well-developed, well-nourished female in no acute distress.  HENT:  Normocephalic, atraumatic EYES: Conjunctivae and EOM are normal. No scleral icterus.  NECK: Normal range of motion SKIN: Skin is warm and dry. No rash noted. Not diaphoretic.No pallor. [place]: Alert and oriented to person, place, and time. Normal coordination.  Pelvic: deferred  Labs and Imaging Surg path  05/24/2020 ENDOMETRIAL CURETTAGE:  - Benign inactive endometrium  - Negative for hyperplasia or malignancy    Assessment & Plan:  4 week post op check following hysteroscopy with endometrial ablation  Doing well  Reviewed her surg path.    Reviewed post op instructions and activities  Gradual increase in activities  F/u in 3 months or sooner prn  All questions answered.   Antanisha Mohs L. Harraway-Smith, M.D., [place]

## 2020-06-21 ENCOUNTER — Other Ambulatory Visit: Payer: Self-pay | Admitting: Family

## 2020-06-24 ENCOUNTER — Inpatient Hospital Stay: Payer: No Typology Code available for payment source | Attending: Hematology & Oncology | Admitting: Family

## 2020-06-24 ENCOUNTER — Inpatient Hospital Stay: Payer: No Typology Code available for payment source

## 2020-07-25 ENCOUNTER — Telehealth: Payer: No Typology Code available for payment source | Admitting: Family

## 2020-07-25 DIAGNOSIS — B372 Candidiasis of skin and nail: Secondary | ICD-10-CM | POA: Diagnosis not present

## 2020-07-25 MED ORDER — FLUCONAZOLE 150 MG PO TABS
150.0000 mg | ORAL_TABLET | ORAL | 0 refills | Status: DC | PRN
Start: 2020-07-25 — End: 2020-09-26

## 2020-07-25 MED ORDER — KETOCONAZOLE 2 % EX CREA: 1.0000 "application " | TOPICAL_CREAM | Freq: Every day | CUTANEOUS | 0 refills | Status: DC

## 2020-07-25 MED FILL — KETOCONAZOLE 2% CREAM: 2 | 30 days supply | Qty: 60 | Fill #0

## 2020-07-25 MED FILL — FLUCONAZOLE 150 MG TABS: 150 | 9 days supply | Qty: 3 | Fill #0

## 2020-07-25 NOTE — Progress Notes (Signed)
E Visit for Rash  We are sorry that you are not feeling well. Here is how we plan to help!      Based upon your presentation it appears you have a fungal infection.  I have prescribed: Diflucan 150 mg tablet and ketoconazole cream that you will apply daily. Keep clean and dry.   HOME CARE:   Take cool showers and avoid direct sunlight.  Apply cool compress or wet dressings.  Take a bath in an oatmeal bath.  Sprinkle content of one Aveeno packet under running faucet with comfortably warm water.  Bathe for 15-20 minutes, 1-2 times daily.  Pat dry with a towel. Do not rub the rash.  Use hydrocortisone cream.  Take an antihistamine like Benadryl for widespread rashes that itch.  The adult dose of Benadryl is 25-50 mg by mouth 4 times daily.  Caution:  This type of medication may cause sleepiness.  Do not drink alcohol, drive, or operate dangerous machinery while taking antihistamines.  Do not take these medications if you have prostate enlargement.  Read package instructions thoroughly on all medications that you take.  GET HELP RIGHT AWAY IF:   Symptoms don't go away after treatment.  Severe itching that persists.  If you rash spreads or swells.  If you rash begins to smell.  If it blisters and opens or develops a yellow-brown crust.  You develop a fever.  You have a sore throat.  You become short of breath.  MAKE SURE YOU:  Understand these instructions. Will watch your condition. Will get help right away if you are not doing well or get worse.  Thank you for choosing an e-visit. Your e-visit answers were reviewed by a board certified advanced clinical practitioner to complete your personal care plan. Depending upon the condition, your plan could have included both over the counter or prescription medications. Please review your pharmacy choice. Be sure that the pharmacy you have chosen is open so that you can pick up your prescription now.  If there is a problem  you may message your provider in [place] to have the prescription routed to another pharmacy. Your safety is important to [place]. If you have drug allergies check your prescription carefully.  For the next 24 hours, you can use MyChart to ask questions about todays visit, request a non-urgent call back, or ask for a work or school excuse from your e-visit provider. You will get an email in the next two days asking about your experience. I hope that your e-visit has been valuable and will speed your recovery.    Approximately 5 minutes was spent documenting and reviewing patient's chart.

## 2020-09-23 ENCOUNTER — Telehealth: Payer: Self-pay | Admitting: Family Medicine

## 2020-09-23 NOTE — Telephone Encounter (Signed)
Pt's spouse dropped off document to be filled out by provider (1 page medical statement application document) Pt would like to be called when document ready at [phone]. Document put at front office tray under providers name.

## 2020-09-26 ENCOUNTER — Other Ambulatory Visit: Payer: Self-pay | Admitting: Family Medicine

## 2020-09-26 NOTE — Telephone Encounter (Signed)
Called the patient informed form is completed and at the front for pickup

## 2020-12-14 ENCOUNTER — Other Ambulatory Visit (HOSPITAL_BASED_OUTPATIENT_CLINIC_OR_DEPARTMENT_OTHER): Payer: Self-pay | Admitting: Family Medicine

## 2020-12-14 ENCOUNTER — Ambulatory Visit (INDEPENDENT_AMBULATORY_CARE_PROVIDER_SITE_OTHER): Payer: No Typology Code available for payment source | Admitting: Family Medicine

## 2020-12-14 ENCOUNTER — Other Ambulatory Visit: Payer: Self-pay

## 2020-12-14 ENCOUNTER — Encounter: Payer: Self-pay | Admitting: Family Medicine

## 2020-12-14 VITALS — BP 102/60 | HR 78 | Temp 98.5°F | Ht 68.0 in | Wt 179.0 lb

## 2020-12-14 DIAGNOSIS — T39395A Adverse effect of other nonsteroidal anti-inflammatory drugs [NSAID], initial encounter: Secondary | ICD-10-CM | POA: Diagnosis not present

## 2020-12-14 DIAGNOSIS — K2960 Other gastritis without bleeding: Secondary | ICD-10-CM | POA: Diagnosis not present

## 2020-12-14 MED ORDER — PANTOPRAZOLE SODIUM 40 MG PO TBEC: DELAYED_RELEASE_TABLET | ORAL | 1 refills | Status: DC

## 2020-12-14 MED ORDER — MELOXICAM 7.5 MG PO TABS: ORAL_TABLET | ORAL | 3 refills | Status: DC

## 2020-12-14 MED FILL — PANTOPRAZOLE SOD DR 40 MG T: 40 | 30 days supply | Qty: 30 | Fill #0

## 2020-12-14 MED FILL — MELOXICAM 7.5 MG TABLET: 7.5 | 30 days supply | Qty: 30 | Fill #0

## 2020-12-14 NOTE — Patient Instructions (Signed)
No more over the counter NSAIDs ( motrin, Aleve, Advil, etc).  Consider trying just the meloxicam with Tylenol next time. You can add pantoprazole if you still get the pains.   If the pains last longer than you expect and do not improve after stopping the NSAID, I need to know.  Let us know if you need anything.

## 2020-12-14 NOTE — Progress Notes (Signed)
Chief Complaint  Patient presents with  . Abdominal Pain    Started on Saturday    Subjective: Patient is a 50 y.o. female here for abd pain.  1 week ago, the patient started taking Tylenol and Naprosyn for pelvic cramping.  Help with the cramps but by Friday, 2 days later, she started having epigastric pain.  It was quite severe over the weekend but has steadily improved.  She did not try thing at home.  She has not taken Naprosyn since then.  She is not having any blood in her stool, dark tarry stools, nausea, vomiting, lightheadedness/weakness, or nighttime awakenings.  Her pain is better today.  Eating does increase her pain little bit.  Past Medical History:  Diagnosis Date  . Abnormal perimenopausal bleeding    Dec 29, 2019 STARTED  . Anemia   . Frequent headaches    TENSION  . History of chicken pox AS CHILD    Objective: BP 102/60 (BP Location: Left Arm, Patient Position: Sitting, Cuff Size: Normal)   Pulse 78   Temp 98.5 F (36.9 C) (Oral)   Ht 5\' 8"  (1.727 m)   Wt 179 lb (81.2 kg)   SpO2 99%   BMI 27.22 kg/m  General: Awake, appears stated age HEENT: No subglossal pallor or jaundice Abdomen: Bowel sounds present, soft, tender to palpation in the epigastric region; nondistended, no masses or organomegaly Heart: RRR Lungs: CTAB, no rales, wheezes or rhonchi. No accessory muscle use Psych: Age appropriate judgment and insight, normal affect and mood  Assessment and Plan: NSAID induced gastritis - Plan: meloxicam (MOBIC) 7.5 MG tablet, pantoprazole (PROTONIX) 40 MG tablet  Okay to continue Tylenol as needed.  Stop over-the-counter anti-inflammatories.  We will prescribe 7.5 mg of Mobic as an alternative with selective Cox inhibition.  She will have the option to use Protonix in conjunction with the meloxicam. The patient voiced understanding and agreement to the plan.  [place], DO 12/14/20  3:37 PM

## 2021-03-06 IMAGING — MG DIGITAL SCREENING BILAT W/ TOMO W/ CAD
8 series · 8 of 24 positions shown · non-contrast
Comparison: Previous exam(s).

CLINICAL DATA: Screening.

EXAM:
DIGITAL SCREENING BILATERAL MAMMOGRAM WITH TOMO AND CAD

[R MLO synth-2D]
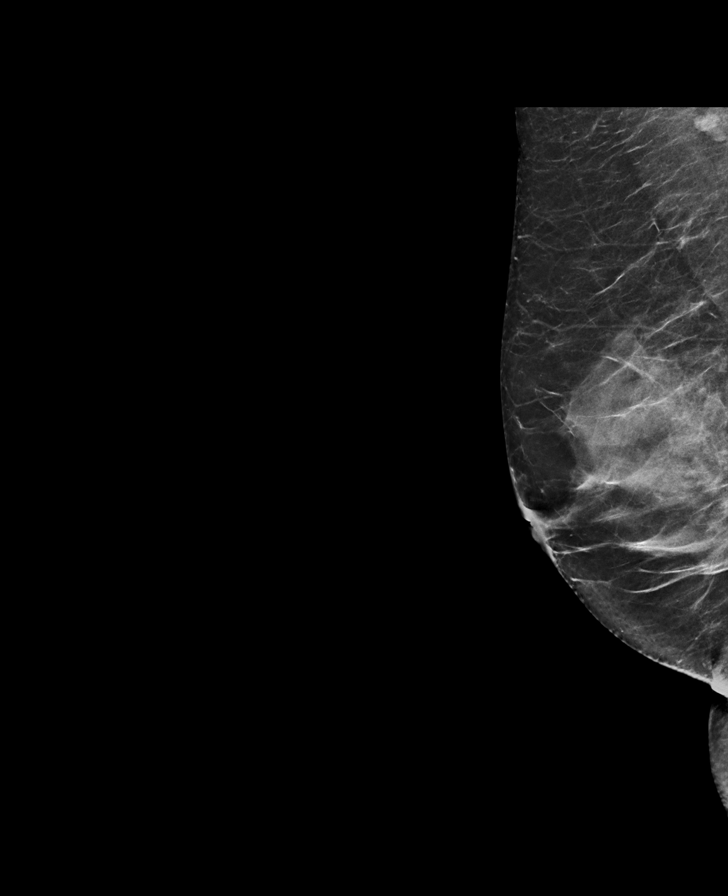

[R CC synth-2D]
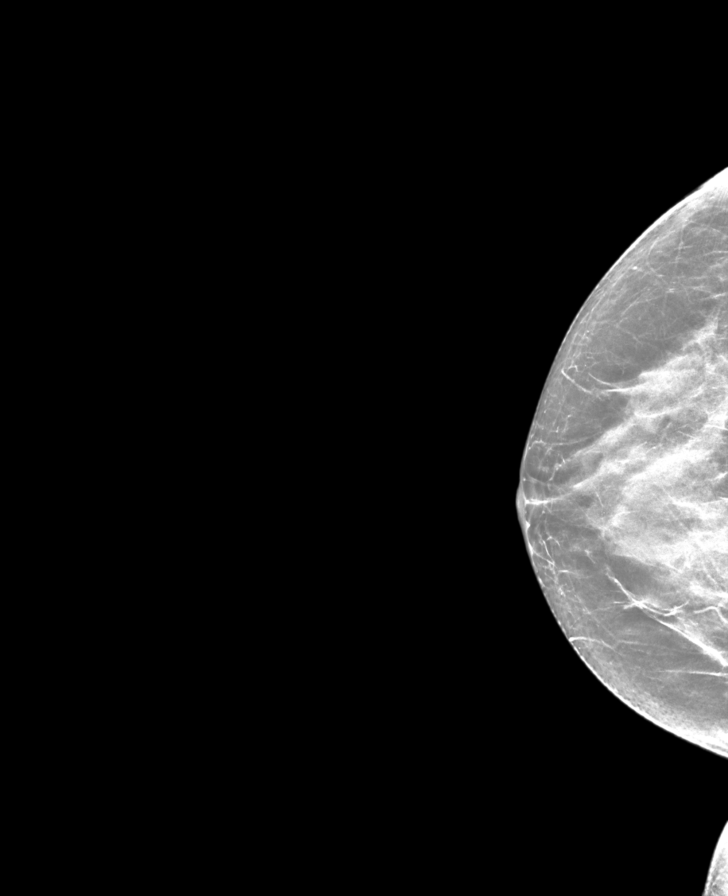

[L MLO synth-2D]
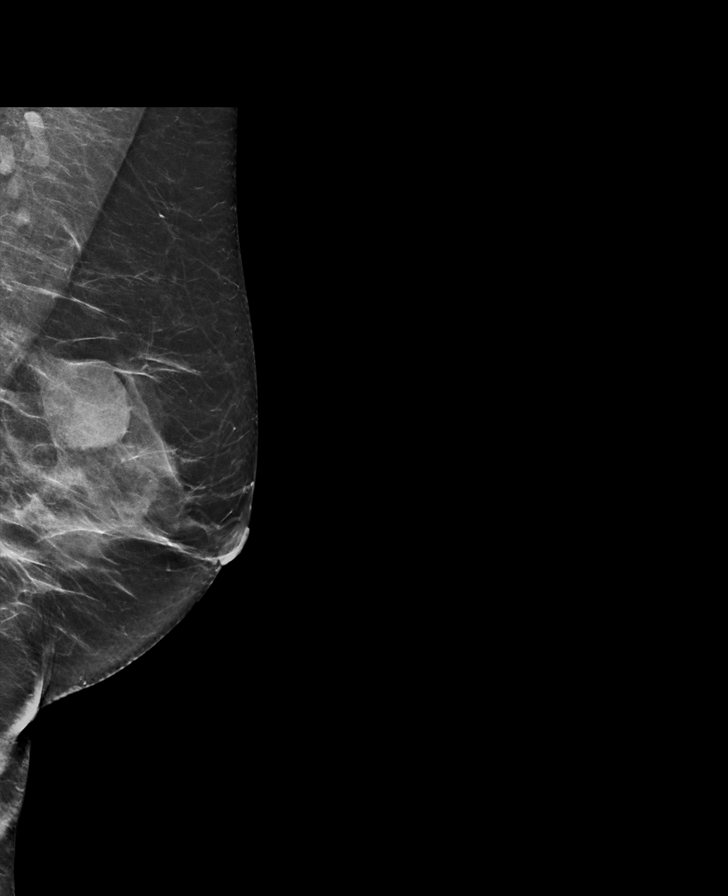

[L CC synth-2D]
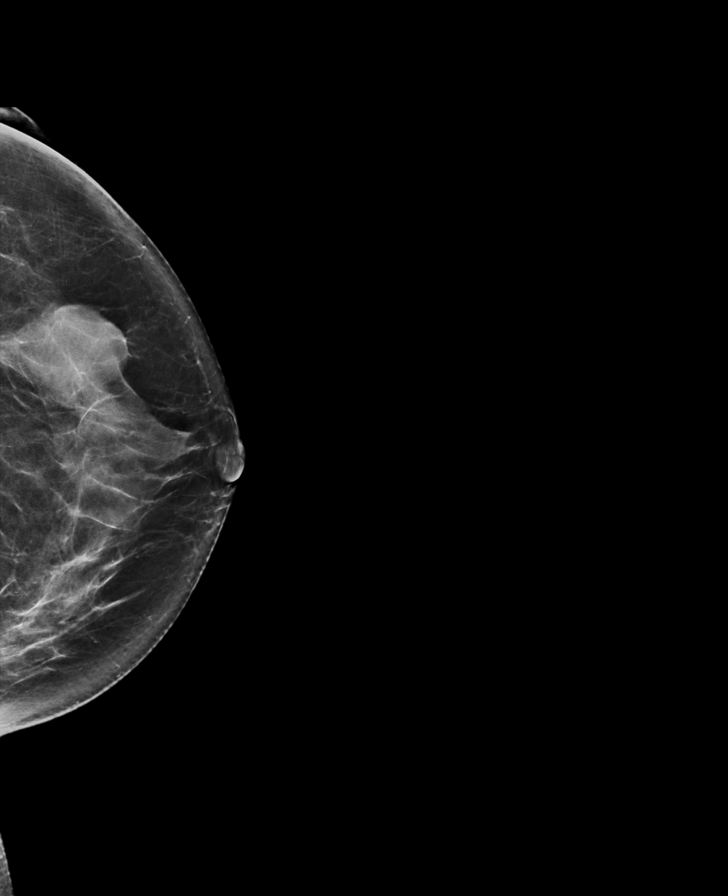

[R MLO tomo · tomo slice 31/62.0]
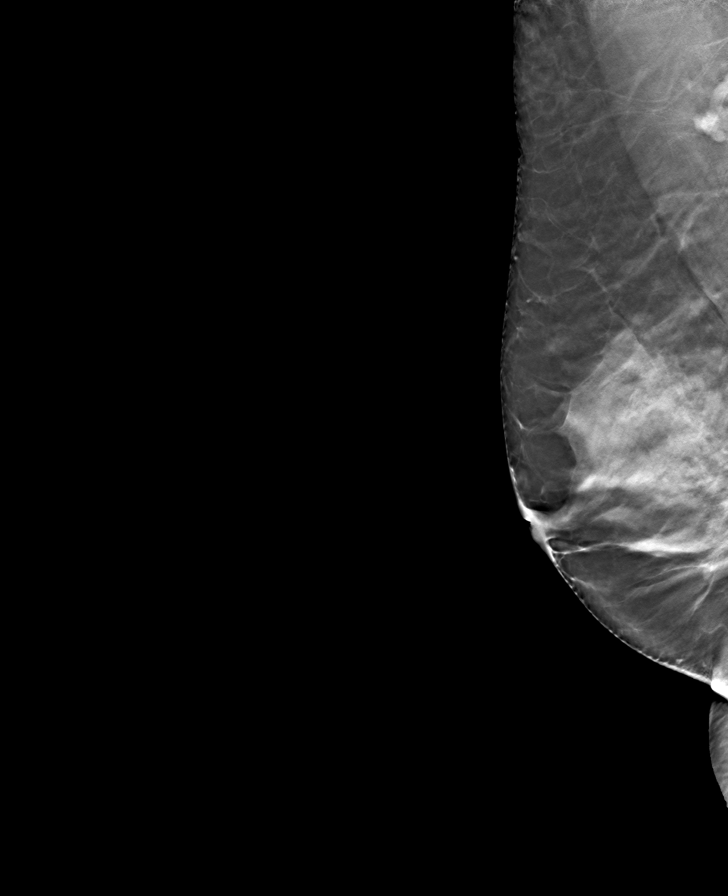

[L MLO tomo · tomo slice 35/68.0]
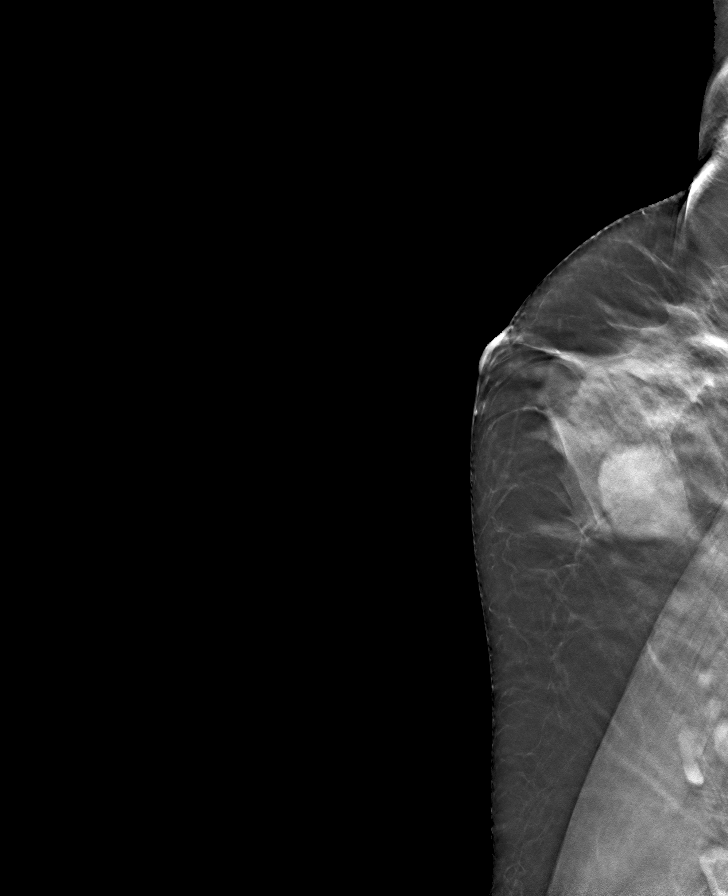

[R CC tomo · tomo slice 27/54.0]
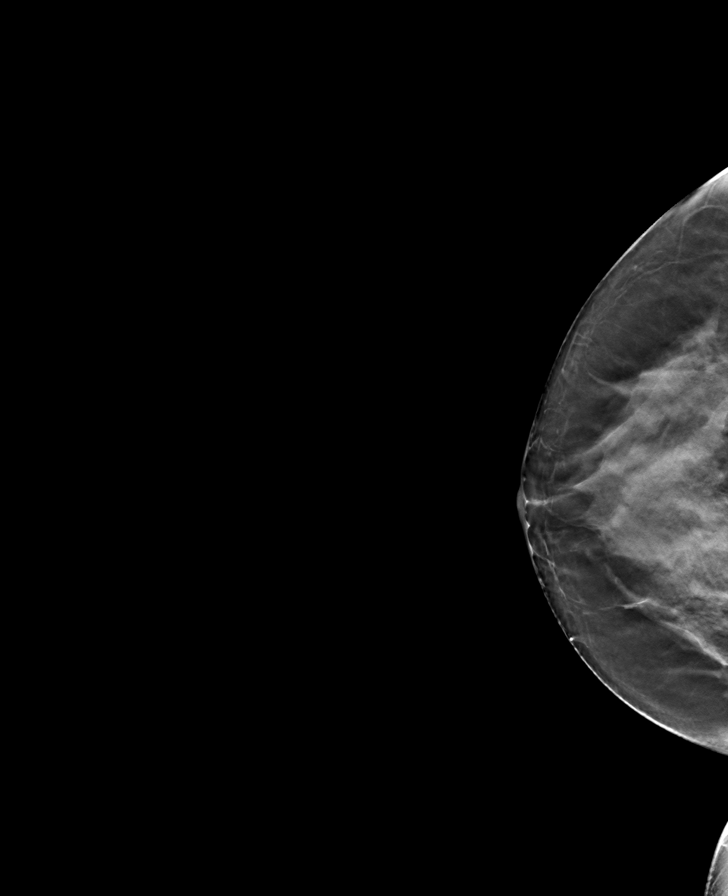

[L CC tomo · tomo slice 36/71.0]
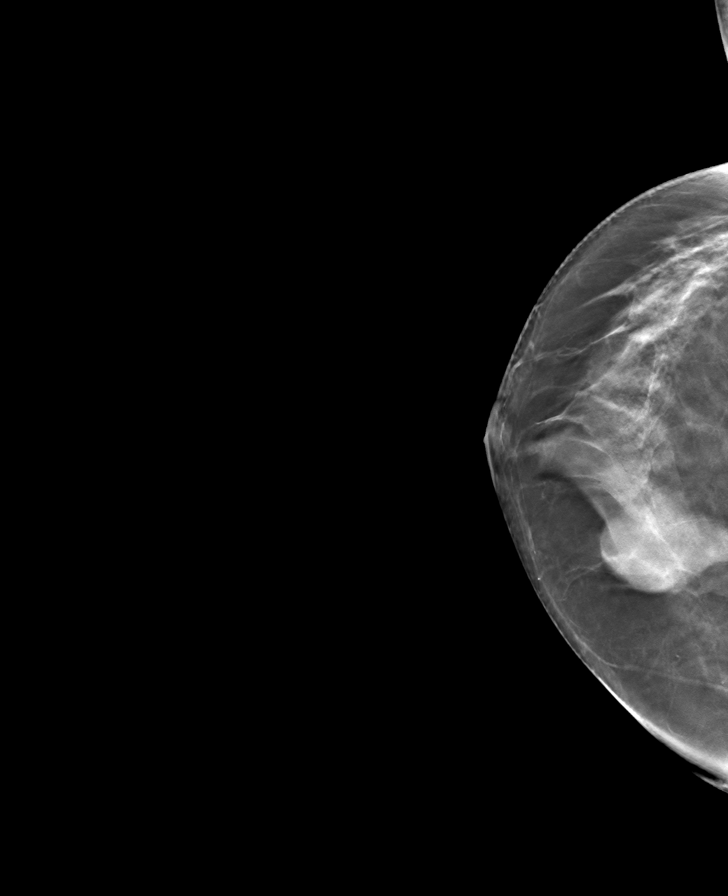

[8 of 24 positions shown; findings below may reference images not displayed]

ACR Breast Density Category c: The breast tissue is heterogeneously
dense, which may obscure small masses.
FINDINGS: In the left breast, a possible mass warrants further evaluation. In
the right breast, no findings suspicious for malignancy. Images were
processed with CAD.
IMPRESSION: Further evaluation is suggested for possible mass in the left
breast.

RECOMMENDATION:
Ultrasound of the left breast. (Code:66-U-[id number])

The patient will be contacted regarding the findings, and additional
imaging will be scheduled.

BI-RADS CATEGORY  0: Incomplete. Need additional imaging evaluation
and/or prior mammograms for comparison.

## 2021-04-06 ENCOUNTER — Other Ambulatory Visit (HOSPITAL_COMMUNITY): Payer: Self-pay

## 2021-04-06 MED ORDER — CARESTART COVID-19 HOME TEST VI KIT
PACK | 0 refills | Status: DC
  Filled 2021-04-06: qty 4, 4d supply, fill #0

## 2021-04-10 ENCOUNTER — Other Ambulatory Visit: Payer: Self-pay | Admitting: Family Medicine

## 2021-04-10 ENCOUNTER — Ambulatory Visit (INDEPENDENT_AMBULATORY_CARE_PROVIDER_SITE_OTHER): Payer: No Typology Code available for payment source | Admitting: Family Medicine

## 2021-04-10 ENCOUNTER — Ambulatory Visit: Payer: No Typology Code available for payment source | Admitting: Family Medicine

## 2021-04-10 ENCOUNTER — Encounter: Payer: Self-pay | Admitting: Family Medicine

## 2021-04-10 ENCOUNTER — Other Ambulatory Visit: Payer: Self-pay

## 2021-04-10 VITALS — BP 118/78 | HR 70 | Temp 97.8°F | Ht 68.0 in | Wt 180.2 lb

## 2021-04-10 DIAGNOSIS — Z1211 Encounter for screening for malignant neoplasm of colon: Secondary | ICD-10-CM

## 2021-04-10 DIAGNOSIS — Z0000 Encounter for general adult medical examination without abnormal findings: Secondary | ICD-10-CM

## 2021-04-10 LAB — LIPID PANEL
Cholesterol: 163 mg/dL (ref 0–200)
HDL: 72.2 mg/dL (ref >39.00)
LDL Cholesterol: 78 mg/dL (ref 0–99)
NonHDL: 90.52
Total CHOL/HDL Ratio: 2
Triglycerides: 62 mg/dL (ref 0.0–149.0)
VLDL: 12.4 mg/dL (ref 0.0–40.0)

## 2021-04-10 LAB — COMPREHENSIVE METABOLIC PANEL
ALT: 9 U/L (ref 0–35)
AST: 12 U/L (ref 0–37)
Albumin: 4.2 g/dL (ref 3.5–5.2)
Alkaline Phosphatase: 49 U/L (ref 39–117)
BUN: 14 mg/dL (ref 6–23)
CO2: 27 mEq/L (ref 19–32)
Calcium: 9.2 mg/dL (ref 8.4–10.5)
Chloride: 106 mEq/L (ref 96–112)
Creatinine, Ser: 0.8 mg/dL (ref 0.40–1.20)
GFR: 85.08 mL/min (ref >60.00)
Glucose, Bld: 87 mg/dL (ref 70–99)
Potassium: 4.2 mEq/L (ref 3.5–5.1)
Sodium: 139 mEq/L (ref 135–145)
Total Bilirubin: 0.5 mg/dL (ref 0.2–1.2)
Total Protein: 6.4 g/dL (ref 6.0–8.3)

## 2021-04-10 LAB — CBC
HCT: 37.1 % (ref 36.0–46.0)
Hemoglobin: 12.8 g/dL (ref 12.0–15.0)
MCHC: 34.5 g/dL (ref 30.0–36.0)
MCV: 95.8 fl (ref 78.0–100.0)
Platelets: 285 10*3/uL (ref 150.0–400.0)
RBC: 3.87 Mil/uL (ref 3.87–5.11)
RDW: 12.4 % (ref 11.5–15.5)
WBC: 6.3 10*3/uL (ref 4.0–10.5)

## 2021-04-10 NOTE — Progress Notes (Signed)
Chief Complaint  Patient presents with  . Annual Exam     Well Woman Ann Owen is here for a complete physical.   Her last physical was >1 year ago.  Current diet: in general, a "healthy" diet. Current exercise: walking.  Weight is stable and she denies fatigue out of ordinary. Seatbelt? Yes  Health Maintenance Pap/HPV- Yes Mammogram- Yes Colon cancer screening-No Shingrix- No Tetanus- Yes Hep C screening- Yes HIV screening- Yes  Past Medical History:  Diagnosis Date  . Abnormal perimenopausal bleeding    Dec 29, 2019 STARTED  . Anemia   . Frequent headaches    TENSION  . History of chicken pox AS CHILD     Past Surgical History:  Procedure Laterality Date  . DILITATION & CURRETTAGE/HYSTROSCOPY WITH NOVASURE ABLATION N/A 05/24/2020   Procedure: HYSTEROSCOPY WITH NOVASURE ABLATION AND RESECTION OF FIBROID WITH MYOSURE;  Surgeon: Lavonia Drafts, MD;  Location: [facility];  Service: Gynecology;  Laterality: N/A;  . NO PAST SURGERIES      Medications  Current Outpatient Medications on File Prior to Visit  Medication Sig Dispense Refill  . acetaminophen (TYLENOL) 500 MG tablet Take 500 mg by mouth 3 (three) times daily as needed.    Marland Kitchen COVID-19 At Home Antigen Test [facility] COVID-19 HOME TEST) KIT use as directed 4 each 0  . meloxicam (MOBIC) 7.5 MG tablet Take 1 tab daily as needed for cramps. 30 tablet 3  . Multiple Vitamin (MULTIVITAMIN) tablet Take 1 tablet by mouth daily.    . pantoprazole (PROTONIX) 40 MG tablet Take 1 tab with every dose of the meloxicam/Mobic. 30 tablet 1  . senna (SENOKOT) 8.6 MG tablet Take 1 tablet by mouth daily.      Allergies No Known Allergies  Review of Systems: Constitutional:  no unexpected weight changes Eye:  no recent significant change in vision Ear/Nose/Mouth/Throat:  Ears:  no recent change in hearing Nose/Mouth/Throat:  no complaints of nasal congestion, no sore throat Cardiovascular: no  chest pain Respiratory:  no shortness of breath Gastrointestinal:  no abdominal pain, no change in bowel habits GU:  Female: negative for dysuria or pelvic pain Musculoskeletal/Extremities:  no pain of the joints Integumentary (Skin/Breast):  no abnormal skin lesions reported Neurologic:  no headaches Endocrine:  denies fatigue  Exam BP 118/78 (BP Location: Left Arm, Patient Position: Sitting, Cuff Size: Normal)   Pulse 70   Temp 97.8 F (36.6 C) (Oral)   Ht $R'5\' 8"'oH$  (1.727 m)   Wt 180 lb 4 oz (81.8 kg)   SpO2 98%   BMI 27.41 kg/m  General:  well developed, well nourished, in no apparent distress Skin:  no significant moles, warts, or growths Head:  no masses, lesions, or tenderness Eyes:  pupils equal and round, sclera anicteric without injection Ears:  canals without lesions, TMs shiny without retraction, no obvious effusion, no erythema Nose:  nares patent, septum midline, mucosa normal, and no drainage or sinus tenderness Throat/Pharynx:  lips and gingiva without lesion; tongue and uvula midline; non-inflamed pharynx; no exudates or postnasal drainage Neck: neck supple without adenopathy, thyromegaly, or masses Lungs:  clear to auscultation, breath sounds equal bilaterally, no respiratory distress Cardio:  regular rate and rhythm, no LE edema Abdomen:  abdomen soft, nontender; bowel sounds normal; no masses or organomegaly Genital: Defer to GYN Musculoskeletal:  symmetrical muscle groups noted without atrophy or deformity Extremities:  no clubbing, cyanosis, or edema, no deformities, no skin discoloration Neuro:  gait normal; deep  tendon reflexes normal and symmetric Psych: well oriented with normal range of affect and appropriate judgment/insight  Assessment and Plan  Well adult exam - Plan: CBC, Comprehensive metabolic panel, Lipid panel  Screen for colon cancer - Plan: Ambulatory referral to Gastroenterology   Well 50 y.o. female. Counseled on diet and exercise. Other  orders as above. Discussed Shingrix, she will plan accordingly.  Refer GI for CCS.  Follow up in 1 yr or prn. The patient voiced understanding and agreement to the plan.  [employer], DO 04/10/21 7:28 AM

## 2021-04-10 NOTE — Patient Instructions (Addendum)
Give [place] 2-3 business days to get the results of your labs back.   Keep the diet clean and stay active.  The new Shingrix vaccine (for shingles) is a 2 shot series. It can make people feel low energy, achy and almost like they have the flu for 48 hours after injection. Please plan accordingly when deciding on when to get this shot. Call our office for a nurse visit appointment to get this. The second shot of the series is less severe regarding the side effects, but it still lasts 48 hours.   If you do not hear anything about your referral in the next 1-2 weeks, call our office and ask for an update.   Let us know if you need anything.

## 2021-06-15 ENCOUNTER — Ambulatory Visit (AMBULATORY_SURGERY_CENTER): Payer: No Typology Code available for payment source

## 2021-06-15 ENCOUNTER — Other Ambulatory Visit (HOSPITAL_BASED_OUTPATIENT_CLINIC_OR_DEPARTMENT_OTHER): Payer: Self-pay

## 2021-06-15 ENCOUNTER — Other Ambulatory Visit: Payer: Self-pay

## 2021-06-15 VITALS — Ht 68.0 in | Wt 179.0 lb

## 2021-06-15 DIAGNOSIS — Z1211 Encounter for screening for malignant neoplasm of colon: Secondary | ICD-10-CM

## 2021-06-15 MED ORDER — NA SULFATE-K SULFATE-MG SULF 17.5-3.13-1.6 GM/177ML PO SOLN
1.0000 | Freq: Once | ORAL | 0 refills | Status: AC
Start: 2021-06-15 — End: 2021-06-17
  Filled 2021-06-15: qty 354, 1d supply, fill #0

## 2021-06-15 NOTE — Progress Notes (Signed)

## 2021-06-22 ENCOUNTER — Encounter: Payer: Self-pay | Admitting: Family

## 2021-06-30 ENCOUNTER — Other Ambulatory Visit: Payer: Self-pay

## 2021-06-30 ENCOUNTER — Ambulatory Visit (AMBULATORY_SURGERY_CENTER): Payer: No Typology Code available for payment source | Admitting: Gastroenterology

## 2021-06-30 ENCOUNTER — Encounter: Payer: Self-pay | Admitting: Gastroenterology

## 2021-06-30 VITALS — BP 111/69 | HR 60 | Temp 97.7°F | Resp 11 | Ht 68.0 in | Wt 179.0 lb

## 2021-06-30 DIAGNOSIS — Z1211 Encounter for screening for malignant neoplasm of colon: Secondary | ICD-10-CM

## 2021-06-30 DIAGNOSIS — D125 Benign neoplasm of sigmoid colon: Secondary | ICD-10-CM

## 2021-06-30 MED ORDER — SODIUM CHLORIDE 0.9 % IV SOLN: 500.0000 mL | Freq: Once | INTRAVENOUS | Status: DC

## 2021-06-30 NOTE — Patient Instructions (Signed)
Discharge instructions given. Handouts on polyps and Diverticulosis. Resume previous medications. YOU HAD AN ENDOSCOPIC PROCEDURE TODAY AT [facility]:   Refer to the procedure report that was given to you for any specific questions about what was found during the examination.  If the procedure report does not answer your questions, please call your gastroenterologist to clarify.  If you requested that your care partner not be given the details of your procedure findings, then the procedure report has been included in a sealed envelope for you to review at your convenience later.  YOU SHOULD EXPECT: Some feelings of bloating in the abdomen. Passage of more gas than usual.  Walking can help get rid of the air that was put into your GI tract during the procedure and reduce the bloating. If you had a lower endoscopy (such as a colonoscopy or flexible sigmoidoscopy) you may notice spotting of blood in your stool or on the toilet paper. If you underwent a bowel prep for your procedure, you may not have a normal bowel movement for a few days.  Please Note:  You might notice some irritation and congestion in your nose or some drainage.  This is from the oxygen used during your procedure.  There is no need for concern and it should clear up in a day or so.  SYMPTOMS TO REPORT IMMEDIATELY:  Following lower endoscopy (colonoscopy or flexible sigmoidoscopy):  Excessive amounts of blood in the stool  Significant tenderness or worsening of abdominal pains  Swelling of the abdomen that is new, acute  Fever of 100F or higher   For urgent or emergent issues, a gastroenterologist can be reached at any hour by calling [phone]. Do not use MyChart messaging for urgent concerns.    DIET:  We do recommend a small meal at first, but then you may proceed to your regular diet.  Drink plenty of fluids but you should avoid alcoholic beverages for 24 hours.  ACTIVITY:  You should plan to take it  easy for the rest of today and you should NOT DRIVE or use heavy machinery until tomorrow (because of the sedation medicines used during the test).    FOLLOW UP: Our staff will call the number listed on your records 48-72 hours following your procedure to check on you and address any questions or concerns that you may have regarding the information given to you following your procedure. If we do not reach you, we will leave a message.  We will attempt to reach you two times.  During this call, we will ask if you have developed any symptoms of COVID 19. If you develop any symptoms (ie: fever, flu-like symptoms, shortness of breath, cough etc.) before then, please call [phone].  If you test positive for Covid 19 in the 2 weeks post procedure, please call and report this information to [place].    If any biopsies were taken you will be contacted by phone or by letter within the next 1-3 weeks.  Please call us at [phone] if you have not heard about the biopsies in 3 weeks.    SIGNATURES/CONFIDENTIALITY: You and/or your care partner have signed paperwork which will be entered into your electronic medical record.  These signatures attest to the fact that that the information above on your After Visit Summary has been reviewed and is understood.  Full responsibility of the confidentiality of this discharge information lies with you and/or your care-partner.

## 2021-06-30 NOTE — Op Note (Signed)
[place] Patient Name: Ann Owen Procedure Date: 06/30/2021 10:00 AM MRN: [id number] Endoscopist: Jackquline Denmark , MD Age: 50 Referring MD:  Date of Birth: 07-21-1971 Gender: Female Account #: [id number] Procedure:                Colonoscopy Indications:              Screening for colorectal malignant neoplasm Medicines:                Monitored Anesthesia Care Procedure:                Pre-Anesthesia Assessment:                           - Prior to the procedure, a History and Physical                            was performed, and patient medications and                            allergies were reviewed. The patient's tolerance of                            previous anesthesia was also reviewed. The risks                            and benefits of the procedure and the sedation                            options and risks were discussed with the patient.                            All questions were answered, and informed consent                            was obtained. Prior Anticoagulants: The patient has                            taken no previous anticoagulant or antiplatelet                            agents. ASA Grade Assessment: II - A patient with                            mild systemic disease. After reviewing the risks                            and benefits, the patient was deemed in                            satisfactory condition to undergo the procedure.                           After obtaining informed consent, the colonoscope  was passed under direct vision. Throughout the                            procedure, the patient's blood pressure, pulse, and                            oxygen saturations were monitored continuously. The                            CF [id number] was introduced through the anus                            and advanced to the 2 cm into the ileum. The                            colonoscope was passed  with some difficulty through                            the sigmoid colon, thereafter with ease to the                            terminal ileum. The patient tolerated the procedure                            well. The quality of the bowel preparation was                            good. The terminal ileum, ileocecal valve,                            appendiceal orifice, and rectum were photographed. Scope In: 10:16:11 AM Scope Out: 10:29:24 AM Scope Withdrawal Time: 0 hours 8 minutes 6 seconds  Total Procedure Duration: 0 hours 13 minutes 13 seconds  Findings:                 A 6 mm polyp was found in the proximal sigmoid                            colon. The polyp was sessile. The polyp was removed                            with a cold snare. Resection and retrieval were                            complete.                           Multiple medium-mouthed diverticula were found in                            the sigmoid colon and rare in descending colon.                            There  was some luminal narrowing of the sigmoid                            colon c/w muscular hypertrophy. Few diverticula had                            stool impacted. No endoscopic evidence of                            diverticulitis.                           Non-bleeding internal hemorrhoids were found during                            retroflexion. The hemorrhoids were small.                           The terminal ileum appeared normal.                           The exam was otherwise without abnormality on                            direct and retroflexion views. Complications:            No immediate complications. Estimated Blood Loss:     Estimated blood loss: none. Impression:               - One 6 mm polyp in the proximal sigmoid colon,                            removed with a cold snare. Resected and retrieved.                           - Moderate sigmoid diverticulosis.                            - Non-bleeding internal hemorrhoids.                           - The examined portion of the ileum was normal.                           - The examination was otherwise normal on direct                            and retroflexion views. Recommendation:           - Patient has a contact number available for                            emergencies. The signs and symptoms of potential                            delayed complications were discussed with the  patient. Return to normal activities tomorrow.                            Written discharge instructions were provided to the                            patient.                           - High fiber diet.                           - Continue present medications.                           - Use Benefiber one teaspoon PO daily, if with                            constipation.                           - Await pathology results.                           - Repeat colonoscopy for surveillance based on                            pathology results.                           - The findings and recommendations were discussed                            with the patient's family. Jackquline Denmark, MD 06/30/2021 10:37:31 AM This report has been signed electronically.

## 2021-06-30 NOTE — Progress Notes (Signed)
To PACU, VSS. Report to [email or web address] 

## 2021-06-30 NOTE — Progress Notes (Signed)
Pt's states no medical or surgical changes since previsit or office visit. VS assessed by C.W 

## 2021-06-30 NOTE — Progress Notes (Signed)
[facility] History and Physical   Primary Care Physician:  Shelda Pal, DO   Reason for Procedure:   Colorectal cancer screening  Plan:     colonoscopy     HPI: Ann Owen is a 50 y.o. female   for screening  Past Medical History:  Diagnosis Date   Abnormal perimenopausal bleeding    Dec 29, 2019 STARTED   Anemia    Frequent headaches    TENSION   History of chicken pox AS CHILD    Past Surgical History:  Procedure Laterality Date   DILITATION & CURRETTAGE/HYSTROSCOPY WITH NOVASURE ABLATION N/A 05/24/2020   Procedure: HYSTEROSCOPY WITH NOVASURE ABLATION AND RESECTION OF FIBROID WITH MYOSURE;  Surgeon: Lavonia Drafts, MD;  Location: [facility];  Service: Gynecology;  Laterality: N/A;   NO PAST SURGERIES      Prior to Admission medications   Medication Sig Start Date End Date Taking? Authorizing Provider  acetaminophen (TYLENOL) 500 MG tablet Take 500 mg by mouth 3 (three) times daily as needed.    [provider]  meloxicam (MOBIC) 7.5 MG tablet Take 1 tab daily as needed for cramps. 12/14/20   Shelda Pal, DO  Multiple Vitamin (MULTIVITAMIN) tablet Take 1 tablet by mouth daily.    [provider]  pantoprazole (PROTONIX) 40 MG tablet Take 1 tab with every dose of the meloxicam/Mobic. 12/14/20   Shelda Pal, DO  senna (SENOKOT) 8.6 MG tablet Take 1 tablet by mouth daily.    [provider]    Current Outpatient Medications  Medication Sig Dispense Refill   acetaminophen (TYLENOL) 500 MG tablet Take 500 mg by mouth 3 (three) times daily as needed.     meloxicam (MOBIC) 7.5 MG tablet Take 1 tab daily as needed for cramps. 30 tablet 3   Multiple Vitamin (MULTIVITAMIN) tablet Take 1 tablet by mouth daily.     pantoprazole (PROTONIX) 40 MG tablet Take 1 tab with every dose of the meloxicam/Mobic. 30 tablet 1   senna (SENOKOT) 8.6 MG tablet Take 1 tablet by mouth daily.      Current Facility-Administered Medications  Medication Dose Route Frequency Provider Last Rate Last Admin   0.9 %  sodium chloride infusion  500 mL Intravenous Once Jackquline Denmark, MD        Allergies as of 06/30/2021   (No Known Allergies)    Family History  Problem Relation Age of Onset   Hyperlipidemia Mother    Arthritis Father    Cancer Maternal Grandmother        Bone   Colon cancer Neg Hx    Esophageal cancer Neg Hx    Stomach cancer Neg Hx    Rectal cancer Neg Hx     Social History   Socioeconomic History   Marital status: Married    Spouse name: Not on file   Number of children: Not on file   Years of education: Not on file   Highest education level: Not on file  Occupational History   Not on file  Tobacco Use   Smoking status: Never   Smokeless tobacco: Never  Vaping Use   Vaping Use: Never used  Substance and Sexual Activity   Alcohol use: No   Drug use: No   Sexual activity: Yes  Other Topics Concern   Not on file  Social History Narrative   Not on file   Social Determinants of Health   Financial Resource Strain: Not on file  Food Insecurity: Not on file  Transportation Needs: Not on file  Physical Activity: Not on file  Stress: Not on file  Social Connections: Not on file  Intimate Partner Violence: Not on file    Review of Systems: Positive for no new All other review of systems negative except as mentioned in the HPI.  Physical Exam: Vital signs in last 24 hours: '@VSRANGES'$ @   General:   Alert,  Well-developed, well-nourished, pleasant and cooperative in NAD Lungs:  Clear throughout to auscultation.   Heart:  Regular rate and rhythm; no murmurs, clicks, rubs,  or gallops. Abdomen:  Soft, nontender and nondistended. Normal bowel sounds.   Neuro/Psych:  Alert and cooperative. Normal mood and affect. A and O x 3    No significant changes were identified.  The patient continues to be an appropriate candidate for the planned procedure  and anesthesia.   Carmell Austria, MD. [facility] 06/30/2021 9:51 AM@

## 2021-06-30 NOTE — Progress Notes (Signed)
Called to room to assist during endoscopic procedure.  Patient ID and intended procedure confirmed with present staff. Received instructions for my participation in the procedure from the performing physician.  

## 2021-07-04 ENCOUNTER — Telehealth: Payer: Self-pay | Admitting: *Deleted

## 2021-07-04 NOTE — Telephone Encounter (Signed)
  Follow up Call-  Call back number 06/30/2021  Post procedure Call Back phone  # [phone]  Permission to leave phone message Yes  Some recent data might be hidden     Patient questions:  Do you have a fever, pain , or abdominal swelling? No. Pain Score  0 *  Have you tolerated food without any problems? Yes.    Have you been able to return to your normal activities? Yes.    Do you have any questions about your discharge instructions: Diet   No. Medications  No. Follow up visit  No.  Do you have questions or concerns about your Care? No.  Actions: * If pain score is 4 or above: No action needed, pain <4.  Have you developed a fever since your procedure? no  2.   Have you had an respiratory symptoms (SOB or cough) since your procedure? no  3.   Have you tested positive for COVID 19 since your procedure no  4.   Have you had any family members/close contacts diagnosed with the COVID 19 since your procedure?  no   If yes to any of these questions please route to Joylene John, RN and Joella Prince, RN

## 2021-07-05 ENCOUNTER — Other Ambulatory Visit (HOSPITAL_BASED_OUTPATIENT_CLINIC_OR_DEPARTMENT_OTHER): Payer: Self-pay

## 2021-07-05 MED ORDER — CARESTART COVID-19 HOME TEST VI KIT
PACK | 0 refills | Status: DC
  Filled 2021-07-05: qty 2, 4d supply, fill #0

## 2021-07-12 ENCOUNTER — Encounter: Payer: Self-pay | Admitting: Gastroenterology

## 2021-08-23 ENCOUNTER — Other Ambulatory Visit: Payer: Self-pay | Admitting: Family Medicine

## 2021-08-23 ENCOUNTER — Other Ambulatory Visit (HOSPITAL_BASED_OUTPATIENT_CLINIC_OR_DEPARTMENT_OTHER): Payer: Self-pay

## 2021-09-15 ENCOUNTER — Other Ambulatory Visit (HOSPITAL_BASED_OUTPATIENT_CLINIC_OR_DEPARTMENT_OTHER): Payer: Self-pay

## 2021-09-15 DIAGNOSIS — K2960 Other gastritis without bleeding: Secondary | ICD-10-CM

## 2021-09-15 DIAGNOSIS — T39395A Adverse effect of other nonsteroidal anti-inflammatory drugs [NSAID], initial encounter: Secondary | ICD-10-CM

## 2021-09-15 MED ORDER — MELOXICAM 7.5 MG PO TABS
7.5000 mg | ORAL_TABLET | Freq: Every day | ORAL | 3 refills | Status: DC | PRN
  Filled 2021-09-15: qty 30, 30d supply, fill #0
  Filled 2021-11-22: qty 30, 30d supply, fill #1

## 2021-09-15 MED ORDER — PANTOPRAZOLE SODIUM 40 MG PO TBEC
DELAYED_RELEASE_TABLET | ORAL | 1 refills | Status: DC
  Filled 2021-09-15: qty 60, 60d supply, fill #0

## 2021-09-20 ENCOUNTER — Other Ambulatory Visit (HOSPITAL_BASED_OUTPATIENT_CLINIC_OR_DEPARTMENT_OTHER): Payer: Self-pay

## 2021-11-22 ENCOUNTER — Other Ambulatory Visit (HOSPITAL_BASED_OUTPATIENT_CLINIC_OR_DEPARTMENT_OTHER): Payer: Self-pay

## 2022-03-29 ENCOUNTER — Encounter: Payer: Self-pay | Admitting: Emergency Medicine

## 2022-03-29 ENCOUNTER — Other Ambulatory Visit (HOSPITAL_BASED_OUTPATIENT_CLINIC_OR_DEPARTMENT_OTHER): Payer: Self-pay

## 2022-03-29 ENCOUNTER — Ambulatory Visit
Admission: EM | Admit: 2022-03-29 | Discharge: 2022-03-29 | Disposition: A | Discharge status: Home / Self Care | Payer: No Typology Code available for payment source | Attending: Emergency Medicine | Admitting: Emergency Medicine

## 2022-03-29 DIAGNOSIS — L0100 Impetigo, unspecified: Secondary | ICD-10-CM

## 2022-03-29 MED ORDER — CEPHALEXIN 500 MG PO CAPS
500.0000 mg | ORAL_CAPSULE | Freq: Four times a day (QID) | ORAL | 0 refills | Status: AC
  Filled 2022-03-29: qty 28, 7d supply, fill #0

## 2022-03-29 NOTE — ED Triage Notes (Signed)
Pt here with abrasion to right side of face after falling while walking dog 4 days ago. The wound has been cleaned and kept covered with abx ointment and was fine until last night. Last night it started to become erythremic with copious yellow drainage.

## 2022-03-29 NOTE — Discharge Instructions (Signed)
Per clinical guidelines, the treatment for impetigo is with Keflex which is taken 4 times daily.  Please do not feel like you need to set your alarm clock for midnight to take your fourth daily dose, if you will just please take it at breakfast, lunch, dinner and bedtime that should be sufficient.  Please continue applying topical antibiotic ointment to the wound to 3 times daily.  Prior to ointment application, please wash your face with a gentle cleanser, warm water and pat dry.  Please avoid applying other skin care products to the area until the lesion has stopped weeping.  Please continue to monitor your wound for signs of worsening infection such as increased edema around your eye or jaw, increased honey colored drainage or the drainage changing from clear to thick and purulent.  These are signs that Keflex is not completely treating the infection and requires further evaluation.  Feel free to come here for repeat evaluation or go to the emergency room.    If you begin to have systemic signs of infection such as fever, headache, body aches, chills, fatigue or muscular pain, please go to the emergency room.  Thank you for visiting urgent care today.  We appreciate the opportunity to participate your care.

## 2022-03-29 NOTE — ED Provider Notes (Signed)
[facility]    CSN: [id number] Arrival date & time: 03/29/22  1457    HISTORY   Chief Complaint  Patient presents with   Wound Check   HPI Ann Owen is a 51 y.o. female. Patient presents urgent care complaining of incurring an abrasion on the right side of her face after falling while walking her dog 4 days ago.  Patient states that when has been cleaned and she has been applying antibiotic ointment.  Patient states the wound seem to be healing fine until last night.  Patient states last night it became very red and began to have a significant mount of yellow drainage from it.  The history is provided by the patient.  Past Medical History:  Diagnosis Date   Abnormal perimenopausal bleeding    Dec 29, 2019 STARTED   Anemia    Frequent headaches    TENSION   History of chicken pox AS CHILD   Patient Active Problem List   Diagnosis Date Noted   Abnormal perimenopausal bleeding 05/24/2020   IDA (iron deficiency anemia) 02/29/2020   Past Surgical History:  Procedure Laterality Date   DILITATION & CURRETTAGE/HYSTROSCOPY WITH NOVASURE ABLATION N/A 05/24/2020   Procedure: HYSTEROSCOPY WITH NOVASURE ABLATION AND RESECTION OF FIBROID WITH MYOSURE;  Surgeon: Lavonia Drafts, MD;  Location: [place];  Service: Gynecology;  Laterality: N/A;   NO PAST SURGERIES     OB History     Gravida  2   Para  2   Term  2   Preterm      AB      Living  2      SAB      IAB      Ectopic      Multiple      Live Births  2          Home Medications    Prior to Admission medications   Not on File    Family History Family History  Problem Relation Age of Onset   Hyperlipidemia Mother    Arthritis Father    Cancer Maternal Grandmother        Bone   Colon cancer Neg Hx    Esophageal cancer Neg Hx    Stomach cancer Neg Hx    Rectal cancer Neg Hx    Social History Social History   Tobacco Use   Smoking status: Never    Smokeless tobacco: Never  Vaping Use   Vaping Use: Never used  Substance Use Topics   Alcohol use: No   Drug use: No   Allergies   Patient has no known allergies.  Review of Systems Review of Systems Pertinent findings noted in history of present illness.   Physical Exam Triage Vital Signs ED Triage Vitals  Enc Vitals Group     BP 09/01/21 0827 (!) 147/82     Pulse Rate 09/01/21 0827 72     Resp 09/01/21 0827 18     Temp 09/01/21 0827 98.3 F (36.8 C)     Temp Source 09/01/21 0827 Oral     SpO2 09/01/21 0827 98 %     Weight --      Height --      Head Circumference --      Peak Flow --      Pain Score 09/01/21 0826 5     Pain Loc --      Pain Edu? --      Excl. in  GC? --   No data found.  Updated Vital Signs BP (!) 145/86   Pulse 85   Temp 98.5 F (36.9 C)   Resp 20   SpO2 95%   Physical Exam Vitals and nursing note reviewed.  Constitutional:      General: She is not in acute distress.    Appearance: Normal appearance. She is not ill-appearing.  HENT:     Head: Normocephalic and atraumatic.     Salivary Glands: Right salivary gland is not diffusely enlarged or tender. Left salivary gland is not diffusely enlarged or tender.     Right Ear: Tympanic membrane, ear canal and external ear normal. No drainage. No middle ear effusion. There is no impacted cerumen. Tympanic membrane is not erythematous or bulging.     Left Ear: Tympanic membrane, ear canal and external ear normal. No drainage.  No middle ear effusion. There is no impacted cerumen. Tympanic membrane is not erythematous or bulging.     Nose: Nose normal. No nasal deformity, septal deviation, mucosal edema, congestion or rhinorrhea.     Right Turbinates: Not enlarged, swollen or pale.     Left Turbinates: Not enlarged, swollen or pale.     Right Sinus: No maxillary sinus tenderness or frontal sinus tenderness.     Left Sinus: No maxillary sinus tenderness or frontal sinus tenderness.     Mouth/Throat:      Lips: Pink. No lesions.     Mouth: Mucous membranes are moist. No oral lesions.     Pharynx: Oropharynx is clear. Uvula midline. No posterior oropharyngeal erythema or uvula swelling.     Tonsils: No tonsillar exudate. 0 on the right. 0 on the left.  Eyes:     General: No visual field deficit or scleral icterus.       Right eye: No foreign body, discharge or hordeolum.        Left eye: No foreign body, discharge or hordeolum.     Extraocular Movements: Extraocular movements intact.     Conjunctiva/sclera: Conjunctivae normal.     Right eye: Right conjunctiva is not injected. No chemosis, exudate or hemorrhage.    Left eye: Left conjunctiva is not injected. No chemosis, exudate or hemorrhage.    Comments: Significant periorbital edema of right lower lid without erythema as well as mild swelling along right jawline, see picture below  Neck:     Trachea: Trachea and phonation normal.  Cardiovascular:     Rate and Rhythm: Normal rate and regular rhythm.     Pulses: Normal pulses.     Heart sounds: Normal heart sounds. No murmur heard.   No friction rub. No gallop.  Pulmonary:     Effort: Pulmonary effort is normal. No accessory muscle usage, prolonged expiration or respiratory distress.     Breath sounds: Normal breath sounds. No stridor, decreased air movement or transmitted upper airway sounds. No decreased breath sounds, wheezing, rhonchi or rales.  Chest:     Chest wall: No tenderness.  Musculoskeletal:        General: Normal range of motion.     Cervical back: Normal range of motion and neck supple. Normal range of motion.  Lymphadenopathy:     Cervical: No cervical adenopathy.  Skin:    General: Skin is warm and dry.     Findings: Lesion (Large lesion on right side of face with copious honey crusted drainage.  Please see picture below.) present. No erythema or rash.  Neurological:     General:  No focal deficit present.     Mental Status: She is alert and oriented to person,  place, and time.  Psychiatric:        Mood and Affect: Mood normal.        Behavior: Behavior normal.      Visual Acuity Right Eye Distance:   Left Eye Distance:   Bilateral Distance:    Right Eye Near:   Left Eye Near:    Bilateral Near:     UC Couse / Diagnostics / Procedures:    EKG  Radiology No results found.  Procedures Procedures (including critical care time)  UC Diagnoses / Final Clinical Impressions(s)   I have reviewed the triage vital signs and the nursing notes.  Pertinent labs & imaging results that were available during my care of the patient were reviewed by me and considered in my medical decision making (see chart for details).    Final diagnoses:  Impetigo   Patient advised to continue application of antibiotic ointment 3 times daily after washing face with warm water and mild detergent and drying face.  Patient advised to avoid applying other skin care products to the area until lesions are no longer weeping.  Patient provided with a prescription for Keflex that she should take 4 times daily for the next 7 days.  Patient advised to follow with PCP or return to urgent care for worsening symptoms, go to the emergency room if she begins to exhibit systemic signs of infection.  ED Prescriptions     Medication Sig Dispense Auth. Provider   cephALEXin (KEFLEX) 500 MG capsule Take 1 capsule (500 mg total) by mouth 4 (four) times daily for 7 days. 28 capsule Lynden Oxford Scales, PA-C      PDMP not reviewed this encounter.  Pending results:  Labs Reviewed - No data to display  Medications Ordered in UC: Medications - No data to display  Disposition Upon Discharge:  Condition: stable for discharge home Home: take medications as prescribed; routine discharge instructions as discussed; follow up as advised.  Patient presented with an acute illness with associated systemic symptoms and significant discomfort requiring urgent management. In my opinion,  this is a condition that a prudent lay person (someone who possesses an average knowledge of health and medicine) may potentially expect to result in complications if not addressed urgently such as respiratory distress, impairment of bodily function or dysfunction of bodily organs.   Routine symptom specific, illness specific and/or disease specific instructions were discussed with the patient and/or caregiver at length.   As such, the patient has been evaluated and assessed, work-up was performed and treatment was provided in alignment with urgent care protocols and evidence based medicine.  Patient/parent/caregiver has been advised that the patient may require follow up for further testing and treatment if the symptoms continue in spite of treatment, as clinically indicated and appropriate.  Patient/parent/caregiver has been advised to return to the [facility] or PCP if no better; to PCP or the Emergency Department if new signs and symptoms develop, or if the current signs or symptoms continue to change or worsen for further workup, evaluation and treatment as clinically indicated and appropriate  The patient will follow up with their current PCP if and as advised. If the patient does not currently have a PCP we will assist them in obtaining one.   The patient may need specialty follow up if the symptoms continue, in spite of conservative treatment and management, for further workup, evaluation, consultation and  treatment as clinically indicated and appropriate.   Patient/parent/caregiver verbalized understanding and agreement of plan as discussed.  All questions were addressed during visit.  Please see discharge instructions below for further details of plan.  Discharge Instructions:   Discharge Instructions      Per clinical guidelines, the treatment for impetigo is with Keflex which is taken 4 times daily.  Please do not feel like you need to set your alarm clock for midnight to take your fourth  daily dose, if you will just please take it at breakfast, lunch, dinner and bedtime that should be sufficient.  Please continue applying topical antibiotic ointment to the wound to 3 times daily.  Prior to ointment application, please wash your face with a gentle cleanser, warm water and pat dry.  Please avoid applying other skin care products to the area until the lesion has stopped weeping.  Please continue to monitor your wound for signs of worsening infection such as increased edema around your eye or jaw, increased honey colored drainage or the drainage changing from clear to thick and purulent.  These are signs that Keflex is not completely treating the infection and requires further evaluation.  Feel free to come here for repeat evaluation or go to the emergency room.    If you begin to have systemic signs of infection such as fever, headache, body aches, chills, fatigue or muscular pain, please go to the emergency room.  Thank you for visiting urgent care today.  We appreciate the opportunity to participate your care.      This office note has been dictated using Museum/gallery curator.  Unfortunately, and despite my best efforts, this method of dictation can sometimes lead to occasional typographical or grammatical errors.  I apologize in advance if this occurs.     Lynden Oxford Scales, PA-C 03/29/22 1531

## 2022-04-18 ENCOUNTER — Encounter: Payer: Self-pay | Admitting: Family Medicine

## 2022-04-18 ENCOUNTER — Ambulatory Visit (INDEPENDENT_AMBULATORY_CARE_PROVIDER_SITE_OTHER): Payer: No Typology Code available for payment source | Admitting: Family Medicine

## 2022-04-18 VITALS — BP 112/68 | HR 50 | Temp 98.1°F | Ht 68.0 in | Wt 184.0 lb

## 2022-04-18 DIAGNOSIS — Z0000 Encounter for general adult medical examination without abnormal findings: Secondary | ICD-10-CM | POA: Diagnosis not present

## 2022-04-18 LAB — LIPID PANEL
Cholesterol: 183 mg/dL (ref 0–200)
HDL: 80.1 mg/dL (ref >39.00)
LDL Cholesterol: 76 mg/dL (ref 0–99)
NonHDL: 102.85
Total CHOL/HDL Ratio: 2
Triglycerides: 132 mg/dL (ref 0.0–149.0)
VLDL: 26.4 mg/dL (ref 0.0–40.0)

## 2022-04-18 LAB — COMPREHENSIVE METABOLIC PANEL
ALT: 10 U/L (ref 0–35)
AST: 13 U/L (ref 0–37)
Albumin: 4.6 g/dL (ref 3.5–5.2)
Alkaline Phosphatase: 63 U/L (ref 39–117)
BUN: 12 mg/dL (ref 6–23)
CO2: 29 mEq/L (ref 19–32)
Calcium: 10.4 mg/dL (ref 8.4–10.5)
Chloride: 102 mEq/L (ref 96–112)
Creatinine, Ser: 0.77 mg/dL (ref 0.40–1.20)
GFR: 88.44 mL/min (ref >60.00)
Glucose, Bld: 88 mg/dL (ref 70–99)
Potassium: 4.5 mEq/L (ref 3.5–5.1)
Sodium: 137 mEq/L (ref 135–145)
Total Bilirubin: 0.4 mg/dL (ref 0.2–1.2)
Total Protein: 7.3 g/dL (ref 6.0–8.3)

## 2022-04-18 LAB — CBC
HCT: 38.7 % (ref 36.0–46.0)
Hemoglobin: 13.1 g/dL (ref 12.0–15.0)
MCHC: 33.9 g/dL (ref 30.0–36.0)
MCV: 96.1 fl (ref 78.0–100.0)
Platelets: 320 10*3/uL (ref 150.0–400.0)
RBC: 4.03 Mil/uL (ref 3.87–5.11)
RDW: 12.1 % (ref 11.5–15.5)
WBC: 6.3 10*3/uL (ref 4.0–10.5)

## 2022-04-18 NOTE — Progress Notes (Signed)
Chief Complaint  Patient presents with   Annual Exam   Cough    At night      Well Woman Ann Owen is here for a complete physical.   Her last physical was >1 year ago.  Current diet: in general, a "healthy" diet. Current exercise: yoga. Weight is stable and she denies fatigue out of ordinary. Seatbelt? Yes Advanced directive? Working on it  Health Maintenance Pap/HPV- Yes Mammogram- Yes Colon cancer screening-Yes Shingrix- No Tetanus- Yes Hep C screening- Yes HIV screening- Yes  Past Medical History:  Diagnosis Date   Abnormal perimenopausal bleeding    Dec 29, 2019 STARTED   Anemia    Frequent headaches    TENSION   History of chicken pox AS CHILD     Past Surgical History:  Procedure Laterality Date   DILITATION & CURRETTAGE/HYSTROSCOPY WITH NOVASURE ABLATION N/A 05/24/2020   Procedure: HYSTEROSCOPY WITH NOVASURE ABLATION AND RESECTION OF FIBROID WITH MYOSURE;  Surgeon: Lavonia Drafts, MD;  Location: [place];  Service: Gynecology;  Laterality: N/A;   NO PAST SURGERIES      Medications  Current Outpatient Medications on File Prior to Visit  Medication Sig Dispense Refill   Calcium Carb-Cholecalciferol (CALCIUM 1000 + D PO) Take by mouth.     meloxicam (MOBIC) 7.5 MG tablet Take 7.5 mg by mouth daily.     Multiple Vitamin (MULTIVITAMIN) tablet Take 1 tablet by mouth daily.     pantoprazole (PROTONIX) 40 MG tablet Take 40 mg by mouth daily.     Allergies No Known Allergies  Review of Systems: Constitutional:  no unexpected weight changes Eye:  no recent significant change in vision Ear/Nose/Mouth/Throat:  Ears:  no recent change in hearing Nose/Mouth/Throat:  no complaints of nasal congestion, no sore throat Cardiovascular: no chest pain Respiratory:  no shortness of breath Gastrointestinal:  no abdominal pain, no change in bowel habits GU:  Female: negative for dysuria or pelvic pain Musculoskeletal/Extremities: +L knee  pain; otherwise no pain of the joints Integumentary (Skin/Breast):  no abnormal skin lesions reported Neurologic:  no headaches Endocrine:  denies fatigue  Exam BP 112/68   Pulse (!) 50   Temp 98.1 F (36.7 C) (Oral)   Ht '5\' 8"'$  (1.727 m)   Wt 184 lb (83.5 kg)   SpO2 97%   BMI 27.98 kg/m  General:  well developed, well nourished, in no apparent distress Skin:  no significant moles, warts, or growths Head:  no masses, lesions, or tenderness Eyes:  pupils equal and round, sclera anicteric without injection Ears:  canals without lesions, TMs shiny without retraction, no obvious effusion, no erythema Nose:  nares patent, septum midline, mucosa normal, and no drainage or sinus tenderness Throat/Pharynx:  lips and gingiva without lesion; tongue and uvula midline; non-inflamed pharynx; no exudates or postnasal drainage Neck: neck supple without adenopathy, thyromegaly, or masses Lungs:  clear to auscultation, breath sounds equal bilaterally, no respiratory distress Cardio:  regular rhythm, bradycardic, no LE edema Abdomen:  abdomen soft, nontender; bowel sounds normal; no masses or organomegaly Genital: Defer to GYN Musculoskeletal: mild pain to palpation over the inferior pole of L patella, symmetrical muscle groups noted without atrophy or deformity Extremities:  no clubbing, cyanosis, or edema, no deformities, no skin discoloration Neuro:  gait normal; deep tendon reflexes normal and symmetric Psych: well oriented with normal range of affect and appropriate judgment/insight  Assessment and Plan  Well adult exam - Plan: CBC, Comprehensive metabolic panel, Lipid panel  Well 51 y.o. female. Counseled on diet and exercise. Advanced directive form requested today.  Shingrix rec'd.  Other orders as above. Follow up in 1 yr or prn. The patient voiced understanding and agreement to the plan.  [place], DO 04/18/22 10:52 AM

## 2022-04-18 NOTE — Patient Instructions (Addendum)
Give [place] 2-3 business days to get the results of your labs back.   Keep the diet clean and stay active.  Please get me a copy of your advanced directive form at your convenience.   Send me a message if you would like to try a cough suppressant or inhaler for the cough.   Send me a message in a month if your knee is still bothering you.   Let us know if you need anything.

## 2022-04-30 ENCOUNTER — Encounter: Payer: Self-pay | Admitting: Family Medicine

## 2022-04-30 ENCOUNTER — Other Ambulatory Visit: Payer: Self-pay | Admitting: Family Medicine

## 2022-04-30 MED ORDER — ALBUTEROL SULFATE HFA 108 (90 BASE) MCG/ACT IN AERS
2.0000 | INHALATION_SPRAY | Freq: Four times a day (QID) | RESPIRATORY_TRACT | 0 refills | Status: DC | PRN
  Filled 2022-04-30: qty 6.7, 25d supply, fill #0

## 2022-05-01 ENCOUNTER — Other Ambulatory Visit (HOSPITAL_BASED_OUTPATIENT_CLINIC_OR_DEPARTMENT_OTHER): Payer: Self-pay

## 2022-05-01 ENCOUNTER — Other Ambulatory Visit: Payer: Self-pay | Admitting: Family Medicine

## 2022-05-01 MED ORDER — FLUTICASONE PROPIONATE HFA 110 MCG/ACT IN AERO
2.0000 | INHALATION_SPRAY | Freq: Two times a day (BID) | RESPIRATORY_TRACT | 1 refills | Status: DC
  Filled 2022-05-01: qty 12, 30d supply, fill #0

## 2022-05-11 ENCOUNTER — Other Ambulatory Visit: Payer: Self-pay | Admitting: Family Medicine

## 2022-05-11 ENCOUNTER — Other Ambulatory Visit (HOSPITAL_BASED_OUTPATIENT_CLINIC_OR_DEPARTMENT_OTHER): Payer: Self-pay

## 2022-05-11 ENCOUNTER — Encounter: Payer: Self-pay | Admitting: Family

## 2022-05-11 MED ORDER — FLUTICASONE-SALMETEROL 115-21 MCG/ACT IN AERO
2.0000 | INHALATION_SPRAY | Freq: Two times a day (BID) | RESPIRATORY_TRACT | 2 refills | Status: DC
  Filled 2022-05-11: qty 12, 30d supply, fill #0
  Filled 2022-09-14: qty 12, 30d supply, fill #1

## 2022-06-18 ENCOUNTER — Encounter: Payer: Self-pay | Admitting: Family Medicine

## 2022-06-18 ENCOUNTER — Telehealth (INDEPENDENT_AMBULATORY_CARE_PROVIDER_SITE_OTHER): Payer: No Typology Code available for payment source | Admitting: Family Medicine

## 2022-06-18 ENCOUNTER — Other Ambulatory Visit (HOSPITAL_BASED_OUTPATIENT_CLINIC_OR_DEPARTMENT_OTHER): Payer: Self-pay

## 2022-06-18 DIAGNOSIS — U071 COVID-19: Secondary | ICD-10-CM

## 2022-06-18 MED ORDER — HYDROCODONE BIT-HOMATROP MBR 5-1.5 MG/5ML PO SOLN
5.0000 mL | Freq: Three times a day (TID) | ORAL | 0 refills | Status: DC | PRN
  Filled 2022-06-18: qty 120, 8d supply, fill #0

## 2022-06-18 NOTE — Progress Notes (Signed)
Chief Complaint  Patient presents with   Cough    Congestion Cold for 7 days. Coughing really bad at night.   Sore throat is minor Unable to smell Tested positive for Covid 06/16/22. Tested today 06/18/22 and still positive     Ann Owen here for URI complaints. Due to COVID-19 pandemic, we are interacting via web portal for an electronic face-to-face visit. I verified patient's ID using 2 identifiers. Patient agreed to proceed with visit via this method. Patient is at home, I am at office. Patient and I are present for visit.   Duration: 1 week  Associated symptoms: sinus headache and coughing fits, loss of smell Denies: sinus congestion, sinus pain, rhinorrhea, itchy watery eyes, ear pain, ear drainage, sore throat, wheezing, shortness of breath, and fevers Treatment to date: Mucinex- DM Sick contacts: Was at beach and likely got infected  Past Medical History:  Diagnosis Date   Abnormal perimenopausal bleeding    Dec 29, 2019 STARTED   Anemia    Frequent headaches    TENSION   History of chicken pox AS CHILD    Objective No conversational dyspnea Age appropriate judgment and insight Nml affect and mood  COVID-19 - Plan: HYDROcodone bit-homatropine (HYCODAN) 5-1.5 MG/5ML syrup  Outside of window for antiviral. Syrup as above. Warnings verbalized. Discussed CDC quarantining recs. Continue to push fluids, practice good hand hygiene, cover mouth when coughing. Discussed quarantining recommendation.  F/u prn. If starting to experience irreplaceable fluid loss, shaking, or shortness of breath, seek immediate care. Pt voiced understanding and agreement to the plan.  [place], DO 06/18/22 3:47 PM

## 2022-06-29 ENCOUNTER — Other Ambulatory Visit: Payer: Self-pay | Admitting: Family Medicine

## 2022-06-29 DIAGNOSIS — U071 COVID-19: Secondary | ICD-10-CM

## 2022-07-02 ENCOUNTER — Other Ambulatory Visit (HOSPITAL_BASED_OUTPATIENT_CLINIC_OR_DEPARTMENT_OTHER): Payer: Self-pay

## 2022-07-02 MED ORDER — HYDROCODONE BIT-HOMATROP MBR 5-1.5 MG/5ML PO SOLN
5.0000 mL | Freq: Three times a day (TID) | ORAL | 0 refills | Status: DC | PRN
  Filled 2022-07-02: qty 120, 8d supply, fill #0

## 2022-07-15 ENCOUNTER — Other Ambulatory Visit: Payer: Self-pay | Admitting: Family Medicine

## 2022-07-15 DIAGNOSIS — U071 COVID-19: Secondary | ICD-10-CM

## 2022-07-16 ENCOUNTER — Other Ambulatory Visit (HOSPITAL_BASED_OUTPATIENT_CLINIC_OR_DEPARTMENT_OTHER): Payer: Self-pay

## 2022-07-16 MED ORDER — HYDROCODONE BIT-HOMATROP MBR 5-1.5 MG/5ML PO SOLN
5.0000 mL | Freq: Three times a day (TID) | ORAL | 0 refills | Status: DC | PRN
  Filled 2022-07-16: qty 120, 8d supply, fill #0

## 2022-07-20 ENCOUNTER — Other Ambulatory Visit: Payer: Self-pay | Admitting: Family Medicine

## 2022-07-20 ENCOUNTER — Encounter: Payer: Self-pay | Admitting: Family Medicine

## 2022-07-20 DIAGNOSIS — Z1283 Encounter for screening for malignant neoplasm of skin: Secondary | ICD-10-CM

## 2022-08-01 ENCOUNTER — Other Ambulatory Visit: Payer: Self-pay | Admitting: Family Medicine

## 2022-08-01 DIAGNOSIS — U071 COVID-19: Secondary | ICD-10-CM

## 2022-08-02 ENCOUNTER — Other Ambulatory Visit (HOSPITAL_BASED_OUTPATIENT_CLINIC_OR_DEPARTMENT_OTHER): Payer: Self-pay

## 2022-08-02 MED ORDER — HYDROCODONE BIT-HOMATROP MBR 5-1.5 MG/5ML PO SOLN
5.0000 mL | Freq: Three times a day (TID) | ORAL | 0 refills | Status: DC | PRN
  Filled 2022-08-02: qty 120, 8d supply, fill #0

## 2022-08-23 ENCOUNTER — Other Ambulatory Visit: Payer: Self-pay | Admitting: Family Medicine

## 2022-08-23 DIAGNOSIS — U071 COVID-19: Secondary | ICD-10-CM

## 2022-08-24 ENCOUNTER — Other Ambulatory Visit (HOSPITAL_BASED_OUTPATIENT_CLINIC_OR_DEPARTMENT_OTHER): Payer: Self-pay

## 2022-08-24 MED ORDER — HYDROCODONE BIT-HOMATROP MBR 5-1.5 MG/5ML PO SOLN
5.0000 mL | Freq: Three times a day (TID) | ORAL | 0 refills | Status: DC | PRN
  Filled 2022-08-24: qty 120, 8d supply, fill #0

## 2022-09-07 ENCOUNTER — Encounter: Payer: Self-pay | Admitting: Family Medicine

## 2022-09-14 ENCOUNTER — Other Ambulatory Visit (HOSPITAL_BASED_OUTPATIENT_CLINIC_OR_DEPARTMENT_OTHER): Payer: Self-pay

## 2022-09-14 ENCOUNTER — Other Ambulatory Visit: Payer: Self-pay | Admitting: Family Medicine

## 2022-09-14 DIAGNOSIS — U071 COVID-19: Secondary | ICD-10-CM

## 2022-09-14 MED ORDER — HYDROCODONE BIT-HOMATROP MBR 5-1.5 MG/5ML PO SOLN
5.0000 mL | Freq: Three times a day (TID) | ORAL | 0 refills | Status: DC | PRN
  Filled 2022-09-14: qty 120, 8d supply, fill #0

## 2022-09-14 NOTE — Telephone Encounter (Signed)
Sched appt, refill sent for now. Ty.

## 2022-09-18 ENCOUNTER — Other Ambulatory Visit (HOSPITAL_BASED_OUTPATIENT_CLINIC_OR_DEPARTMENT_OTHER): Payer: Self-pay

## 2022-09-18 ENCOUNTER — Encounter: Payer: Self-pay | Admitting: Family Medicine

## 2022-09-18 ENCOUNTER — Ambulatory Visit (INDEPENDENT_AMBULATORY_CARE_PROVIDER_SITE_OTHER): Payer: No Typology Code available for payment source | Admitting: Family Medicine

## 2022-09-18 ENCOUNTER — Encounter: Payer: Self-pay | Admitting: Family

## 2022-09-18 VITALS — BP 120/82 | HR 78 | Temp 98.2°F | Ht 68.0 in | Wt 189.1 lb

## 2022-09-18 DIAGNOSIS — E669 Obesity, unspecified: Secondary | ICD-10-CM | POA: Diagnosis not present

## 2022-09-18 MED ORDER — SEMAGLUTIDE-WEIGHT MANAGEMENT 1 MG/0.5ML ~~LOC~~ SOAJ
1.0000 mg | SUBCUTANEOUS | 0 refills | Status: DC
  Filled 2022-09-18: qty 2, 28d supply, fill #0

## 2022-09-18 MED ORDER — SEMAGLUTIDE-WEIGHT MANAGEMENT 1.7 MG/0.75ML ~~LOC~~ SOAJ
1.7000 mg | SUBCUTANEOUS | 0 refills | Status: DC
  Filled 2022-09-18: qty 3, 28d supply, fill #0

## 2022-09-18 MED ORDER — SEMAGLUTIDE-WEIGHT MANAGEMENT 2.4 MG/0.75ML ~~LOC~~ SOAJ
2.4000 mg | SUBCUTANEOUS | 0 refills | Status: DC
  Filled 2022-09-18: qty 3, 28d supply, fill #0

## 2022-09-18 MED ORDER — SEMAGLUTIDE-WEIGHT MANAGEMENT 0.25 MG/0.5ML ~~LOC~~ SOAJ
0.2500 mg | SUBCUTANEOUS | 0 refills | Status: DC
  Filled 2022-09-18: qty 2, 28d supply, fill #0

## 2022-09-18 MED ORDER — SEMAGLUTIDE-WEIGHT MANAGEMENT 0.5 MG/0.5ML ~~LOC~~ SOAJ
0.5000 mg | SUBCUTANEOUS | 0 refills | Status: DC
  Filled 2022-09-18: qty 2, 28d supply, fill #0

## 2022-09-18 NOTE — Progress Notes (Signed)
Chief Complaint  Patient presents with   discuss weight loss medication    Subjective: Patient is a 51 y.o. female here for discussion of weight loss medication.  Patient has been steadily gaining weight over the past several years.  She has tried eating healthy and exercising routinely with walking.  This has not gone well.  She has never been on weight loss medication before but is interested in a weekly shot.  Past Medical History:  Diagnosis Date   Abnormal perimenopausal bleeding    Dec 29, 2019 STARTED   Anemia    Frequent headaches    TENSION   History of chicken pox AS CHILD    Objective: BP 120/82 (BP Location: Left Arm, Patient Position: Sitting, Cuff Size: Normal)   Pulse 78   Temp 98.2 F (36.8 C) (Oral)   Ht '5\' 8"'$  (1.727 m)   Wt 189 lb 2 oz (85.8 kg)   SpO2 97%   BMI 28.76 kg/m  General: Awake, appears stated age Heart: RRR, no LE edema Lungs: CTAB, no rales, wheezes or rhonchi. No accessory muscle use Psych: Age appropriate judgment and insight, normal affect and mood  Assessment and Plan: Obesity with serious comorbidity, unspecified classification, unspecified obesity type - Plan: Semaglutide-Weight Management 0.25 MG/0.5ML SOAJ, Semaglutide-Weight Management 0.5 MG/0.5ML SOAJ, Semaglutide-Weight Management 1 MG/0.5ML SOAJ, Semaglutide-Weight Management 1.7 MG/0.75ML SOAJ, Semaglutide-Weight Management 2.4 MG/0.75ML SOAJ  Chronic, uncontrolled.  BMI>27 w a serious comorbidity a/w weight (GERD)= obesity. Will send in [place], hopefully will be covered by insurance.  She will let me know if it is not.  If she is able to get it, we will start titrating every 4 weeks.  Follow-up in 6 weeks to recheck. The patient voiced understanding and agreement to the plan.  [place], DO 09/18/22  3:29 PM

## 2022-09-18 NOTE — Patient Instructions (Signed)
Keep the diet clean and stay active.  Let us know if you need anything. 

## 2022-09-19 ENCOUNTER — Telehealth: Payer: Self-pay

## 2022-09-19 ENCOUNTER — Other Ambulatory Visit (HOSPITAL_BASED_OUTPATIENT_CLINIC_OR_DEPARTMENT_OTHER): Payer: Self-pay

## 2022-09-19 NOTE — Telephone Encounter (Signed)
PA initiated via Covermymeds; KEY: [id number]. Awaiting determination.

## 2022-09-20 ENCOUNTER — Other Ambulatory Visit (HOSPITAL_BASED_OUTPATIENT_CLINIC_OR_DEPARTMENT_OTHER): Payer: Self-pay

## 2022-09-21 ENCOUNTER — Other Ambulatory Visit (HOSPITAL_BASED_OUTPATIENT_CLINIC_OR_DEPARTMENT_OTHER): Payer: Self-pay

## 2022-09-21 NOTE — Telephone Encounter (Signed)
PA was denied. Based on information submitted for review, did not meet guideline rules for this medication. Requires BMI great than 27kg/m or greater and at least one weight-related comorbidity.

## 2022-09-21 NOTE — Telephone Encounter (Signed)
Please let pt know. Did she want to try phentermine?

## 2022-09-21 NOTE — Telephone Encounter (Signed)
Called left message to call back 

## 2022-09-21 NOTE — Telephone Encounter (Signed)
Their requirement for one weight-related comorbidity is hypertension, type 2 diabetes or hyperlipidemia.

## 2022-09-24 ENCOUNTER — Other Ambulatory Visit (HOSPITAL_BASED_OUTPATIENT_CLINIC_OR_DEPARTMENT_OTHER): Payer: Self-pay

## 2022-09-24 NOTE — Telephone Encounter (Signed)
Called left message to call back 

## 2022-09-24 NOTE — Telephone Encounter (Signed)
Pt called back please advise

## 2022-09-25 NOTE — Telephone Encounter (Signed)
Gave her PCP response and she is not interested in phentermine.

## 2022-09-25 NOTE — Telephone Encounter (Signed)
Pt called back please advise

## 2022-10-15 ENCOUNTER — Other Ambulatory Visit: Payer: Self-pay | Admitting: Family Medicine

## 2022-10-15 ENCOUNTER — Encounter: Payer: Self-pay | Admitting: Family

## 2022-10-15 DIAGNOSIS — U071 COVID-19: Secondary | ICD-10-CM

## 2022-10-16 ENCOUNTER — Other Ambulatory Visit (HOSPITAL_BASED_OUTPATIENT_CLINIC_OR_DEPARTMENT_OTHER): Payer: Self-pay

## 2022-10-16 MED ORDER — HYDROCODONE BIT-HOMATROP MBR 5-1.5 MG/5ML PO SOLN
5.0000 mL | Freq: Three times a day (TID) | ORAL | 0 refills | Status: DC | PRN
  Filled 2022-10-16: qty 120, 8d supply, fill #0

## 2022-10-20 ENCOUNTER — Encounter: Payer: Self-pay | Admitting: Family

## 2022-11-06 ENCOUNTER — Encounter: Payer: Self-pay | Admitting: Family

## 2022-11-09 ENCOUNTER — Other Ambulatory Visit (HOSPITAL_BASED_OUTPATIENT_CLINIC_OR_DEPARTMENT_OTHER): Payer: Self-pay

## 2022-11-09 ENCOUNTER — Ambulatory Visit (INDEPENDENT_AMBULATORY_CARE_PROVIDER_SITE_OTHER): Payer: 59 | Admitting: Family Medicine

## 2022-11-09 ENCOUNTER — Encounter: Payer: Self-pay | Admitting: Family Medicine

## 2022-11-09 ENCOUNTER — Telehealth: Payer: Self-pay

## 2022-11-09 VITALS — BP 121/80 | HR 85 | Temp 98.0°F | Ht 68.0 in | Wt 204.0 lb

## 2022-11-09 DIAGNOSIS — E669 Obesity, unspecified: Secondary | ICD-10-CM

## 2022-11-09 DIAGNOSIS — M25531 Pain in right wrist: Secondary | ICD-10-CM

## 2022-11-09 DIAGNOSIS — M25532 Pain in left wrist: Secondary | ICD-10-CM | POA: Diagnosis not present

## 2022-11-09 MED ORDER — SEMAGLUTIDE-WEIGHT MANAGEMENT 1.7 MG/0.75ML ~~LOC~~ SOAJ
1.7000 mg | SUBCUTANEOUS | 0 refills | Status: AC
  Filled 2022-11-09 – 2023-01-01 (×2): qty 3, 28d supply, fill #0

## 2022-11-09 MED ORDER — SEMAGLUTIDE-WEIGHT MANAGEMENT 2.4 MG/0.75ML ~~LOC~~ SOAJ
2.4000 mg | SUBCUTANEOUS | 0 refills | Status: DC
  Filled 2022-11-09: qty 3, 28d supply, fill #0

## 2022-11-09 MED ORDER — SEMAGLUTIDE-WEIGHT MANAGEMENT 0.5 MG/0.5ML ~~LOC~~ SOAJ
0.5000 mg | SUBCUTANEOUS | 0 refills | Status: AC
  Filled 2022-11-09 – 2022-12-17 (×2): qty 2, 28d supply, fill #0

## 2022-11-09 MED ORDER — SEMAGLUTIDE-WEIGHT MANAGEMENT 0.25 MG/0.5ML ~~LOC~~ SOAJ
0.2500 mg | SUBCUTANEOUS | 0 refills | Status: AC
  Filled 2022-11-09: qty 2, 28d supply, fill #0

## 2022-11-09 MED ORDER — SEMAGLUTIDE-WEIGHT MANAGEMENT 1 MG/0.5ML ~~LOC~~ SOAJ
1.0000 mg | SUBCUTANEOUS | 0 refills | Status: AC
  Filled 2022-11-09 – 2022-12-18 (×2): qty 2, 28d supply, fill #0

## 2022-11-09 NOTE — Progress Notes (Signed)
Chief Complaint  Patient presents with   discuss Wegovy    Subjective: Patient is a 52 y.o. female here for f/u.  Patient's cough is fine getting better.  She did improve with a combination ICS/LABA.  She could feel it wearing off before her subsequent dose.  The patient has gained weight.  Her diet is healthy overall.  She has not been exercising due to a cough but is starting to get back into it.  She has never been on any medication before.  She is interested in a weekly shot.  She has had several years of bilateral wrist pain, worse on the left posterior.  No recent injury or change in activity.  Some weakness with prolonged wrist usage.  She does not have any swelling, redness, bruising, or other neurologic signs or symptoms.  Past Medical History:  Diagnosis Date   Abnormal perimenopausal bleeding    Dec 29, 2019 STARTED   Anemia    Frequent headaches    TENSION   History of chicken pox AS CHILD    Objective: BP 121/80 (BP Location: Left Arm, Patient Position: Sitting, Cuff Size: Normal)   Pulse 85   Temp 98 F (36.7 C) (Oral)   Ht '5\' 8"'$  (1.727 m)   Wt 204 lb (92.5 kg)   SpO2 95%   BMI 31.02 kg/m  General: Awake, appears stated age Heart: RRR, no LE edema Lungs: CTAB, no rales, wheezes or rhonchi. No accessory muscle use MSK: Normal active and passive range of motion of both wrists, mild TTP over the forearm extensors crossing the wrist.  Mild discomfort with resisted wrist extension.  No deformity, edema, ecchymosis, erythema, excessive warmth. Psych: Age appropriate judgment and insight, normal affect and mood  Assessment and Plan: Obesity (BMI 30-39.9) - Plan: Semaglutide-Weight Management 0.25 MG/0.5ML SOAJ, Semaglutide-Weight Management 0.5 MG/0.5ML SOAJ, Semaglutide-Weight Management 1 MG/0.5ML SOAJ, Semaglutide-Weight Management 1.7 MG/0.75ML SOAJ, Semaglutide-Weight Management 2.4 MG/0.75ML SOAJ  Bilateral wrist pain  Chronic, uncontrolled.  Start Wegovy at  0.25 mg/week and titrate up every 4 weeks until we get to 2.4 mg weekly or the highest tolerated dosage.  Counseled on diet and exercise.  Follow-up in 6 weeks to recheck this. She has a brace that she wears at home.  She will get back to this.  I will give her some stretches and exercises to use at home.  Consider occupational therapy if no improvement.  Heat, ice, Tylenol. The patient voiced understanding and agreement to the plan.  [place], DO 11/09/22  4:23 PM

## 2022-11-09 NOTE — Patient Instructions (Addendum)
Please schedule your mammogram.  Keep the diet clean and stay active.  Aim to do some physical exertion for 150 minutes per week. This is typically divided into 5 days per week, 30 minutes per day. The activity should be enough to get your heart rate up. Anything is better than nothing if you have time constraints.  Let us know if you need anything.  Wrist and Forearm Exercises Do exercises exactly as told by your health care provider and adjust them as directed. It is normal to feel mild stretching, pulling, tightness, or discomfort as you do these exercises, but you should stop right away if you feel sudden pain or your pain gets worse.   RANGE OF MOTION EXERCISES These exercises warm up your muscles and joints and improve the movement and flexibility of your injured wrist and forearm. These exercises also help to relieve pain, numbness, and tingling. These exercises are done using the muscles in your injured wrist and forearm. Exercise A: Wrist Flexion, Active With your fingers relaxed, bend your wrist forward as far as you can. Hold this position for 30 seconds. Repeat 2 times. Complete this exercise 3 times per week. Exercise B: Wrist Extension, Active With your fingers relaxed, bend your wrist backward as far as you can. Hold this position for 30 seconds. Repeat 2 times. Complete this exercise 3 times per week. Exercise C: Supination, Active  Stand or sit with your arms at your sides. Bend your left / right elbow to an "L" shape (90 degrees). Turn your palm upward until you feel a gentle stretch on the inside of your forearm. Hold this position for 30 seconds. Slowly return your palm to the starting position. Repeat 2 times. Complete this exercise 3 times per week. Exercise D: Pronation, Active  Stand or sit with your arms at your sides. Bend your left / right elbow to an "L" shape (90 degrees). Turn your palm downward until you feel a gentle stretch on the top of your  forearm. Hold this position for 30 seconds. Slowly return your palm to the starting position. Repeat 2 times. Complete this exercise once a day.  STRETCHING EXERCISES These exercises warm up your muscles and joints and improve the movement and flexibility of your injured wrist and forearm. These exercises also help to relieve pain, numbness, and tingling. These exercises are done using your healthy wrist and forearm to help stretch the muscles in your injured wrist and forearm. Exercise E: Wrist Flexion, Passive  Extend your left / right arm in front of you, relax your wrist, and point your fingers downward. Gently push on the back of your hand. Stop when you feel a gentle stretch on the top of your forearm. Hold this position for 30 seconds. Repeat 2 times. Complete this exercise 3 times per week. Exercise F: Wrist Extension, Passive  Extend your left / right arm in front of you and turn your palm upward. Gently pull your palm and fingertips back so your fingers point downward. You should feel a gentle stretch on the palm-side of your forearm. Hold this position for 30 seconds. Repeat 2 times. Complete this exercise 3 times per week. Exercise G: Forearm Rotation, Supination, Passive Sit with your left / right elbow bent to an "L" shape (90 degrees) with your forearm resting on a table. Keeping your upper body and shoulder still, use your other hand to rotate your forearm palm-up until you feel a gentle to moderate stretch. Hold this position for 30 seconds.  Slowly release the stretch and return to the starting position. Repeat 2 times. Complete this exercise 3 times per week. Exercise H: Forearm Rotation, Pronation, Passive Sit with your left / right elbow bent to an "L" shape (90 degrees) with your forearm resting on a table. Keeping your upper body and shoulder still, use your other hand to rotate your forearm palm-down until you feel a gentle to moderate stretch. Hold this position  for 30 seconds. Slowly release the stretch and return to the starting position. Repeat 2 times. Complete this exercise 3 times per week.  STRENGTHENING EXERCISES These exercises build strength and endurance in your wrist and forearm. Endurance is the ability to use your muscles for a long time, even after they get tired. Exercise I: Wrist Flexors  Sit with your left / right forearm supported on a table and your hand resting palm-up over the edge of the table. Your elbow should be bent to an "L" shape (about 90 degrees) and be below the level of your shoulder. Hold a 3-5 lb weight in your left / right hand. Or, hold a rubber exercise band or tube in both hands, keeping your hands at the same level and hip distance apart. There should be a slight tension in the exercise band or tube. Slowly curl your hand up toward your forearm. Hold this position for 3 seconds. Slowly lower your hand back to the starting position. Repeat 2 times. Complete this exercise 3 times per week. Exercise J: Wrist Extensors  Sit with your left / right forearm supported on a table and your hand resting palm-down over the edge of the table. Your elbow should be bent to an "L" shape (about 90 degrees) and be below the level of your shoulder. Hold a 3-5 lb weight in your left / right hand. Or, hold a rubber exercise band or tube in both hands, keeping your hands at the same level and hip distance apart. There should be a slight tension in the exercise band or tube. Slowly curl your hand up toward your forearm. Hold this position for 3 seconds. Slowly lower your hand back to the starting position. Repeat 2 times. Complete this exercise 3 times per week. Exercise K: Forearm Rotation, Supination  Sit with your left / right forearm supported on a table and your hand resting palm-down. Your elbow should be at your side, bent to an "L" shape (about 90 degrees), and below the level of your shoulder. Keep your wrist stable and in a  neutral position throughout the exercise. Gently hold a lightweight hammer with your left / right hand. Without moving your elbow or wrist, slowly rotate your palm upward to a thumbs-up position. Hold this position for 3 seconds. Slowly return your forearm to the starting position. Repeat 2 times. Complete this exercise 3 times per week. Exercise L: Forearm Rotation, Pronation  Sit with your left / right forearm supported on a table and your hand resting palm-up. Your elbow should be at your side, bent to an "L" shape (about 90 degrees), and below the level of your shoulder. Keep your wrist stable. Do not allow it to move backward or forward during the exercise. Gently hold a lightweight hammer with your left / right hand. Without moving your elbow or wrist, slowly rotate your palm and hand upward to a thumbs-up position. Hold this position for 3 seconds. Slowly return your forearm to the starting position. Repeat 2 times. Complete this exercise 3 times per week. Exercise M:  Grip Strengthening  Hold one of these items in your left / right hand: play dough, therapy putty, a dense sponge, a stress ball, or a large, rolled sock. Squeeze as hard as you can without increasing pain. Hold this position for 5 seconds. Slowly release your grip. Repeat 2 times. Complete this exercise 3 times per week.  This information is not intended to replace advice given to you by your health care provider. Make sure you discuss any questions you have with your health care provider. Document Released: 09/05/2005 Document Revised: 07/16/2016 Document Reviewed: 07/17/2015 Elsevier Interactive Patient Education  [employer].

## 2022-11-09 NOTE — Telephone Encounter (Signed)
PA initiated via Covermymeds; KEY: [id number]. Awaiting determination.

## 2022-11-12 ENCOUNTER — Other Ambulatory Visit (HOSPITAL_BASED_OUTPATIENT_CLINIC_OR_DEPARTMENT_OTHER): Payer: Self-pay

## 2022-11-12 ENCOUNTER — Other Ambulatory Visit (HOSPITAL_COMMUNITY): Payer: Self-pay

## 2022-11-12 NOTE — Telephone Encounter (Signed)
PA approved.   The request has been approved. The authorization is effective from 11/12/2022 to 06/03/2023, as long as the member is enrolled in their current health plan. The request was approved as submitted. This request is approved for a quantity of 46m per 28 days.Additional authorizations, eligible from 11/12/2022 through 06/03/2023, have been entered for the following:Wegovy 0.'5mg'$ /0.570mallowing 66m20mer 28 days; please reference authorization [id number] '1mg'$ /0.5mL866mlowing 66mL 35m 28 days; please reference authorization [id number] 1.'7mg'$ /0.75mL 59mwing 3mL pe8m8 day; please reference authorization [id number] 2.'4mg'$ /0.75mL al21mng 3mL per 60mdays; please reference authorization [id number]. A written notification letter will follow with additional details.

## 2022-11-15 ENCOUNTER — Other Ambulatory Visit (HOSPITAL_BASED_OUTPATIENT_CLINIC_OR_DEPARTMENT_OTHER): Payer: Self-pay

## 2022-11-17 ENCOUNTER — Other Ambulatory Visit (HOSPITAL_BASED_OUTPATIENT_CLINIC_OR_DEPARTMENT_OTHER): Payer: Self-pay

## 2022-11-21 ENCOUNTER — Other Ambulatory Visit (HOSPITAL_BASED_OUTPATIENT_CLINIC_OR_DEPARTMENT_OTHER): Payer: Self-pay

## 2022-11-21 ENCOUNTER — Other Ambulatory Visit: Payer: Self-pay

## 2022-12-17 ENCOUNTER — Other Ambulatory Visit (HOSPITAL_BASED_OUTPATIENT_CLINIC_OR_DEPARTMENT_OTHER): Payer: Self-pay

## 2022-12-18 ENCOUNTER — Other Ambulatory Visit (HOSPITAL_BASED_OUTPATIENT_CLINIC_OR_DEPARTMENT_OTHER): Payer: Self-pay

## 2022-12-18 ENCOUNTER — Other Ambulatory Visit (HOSPITAL_COMMUNITY): Payer: Self-pay

## 2022-12-19 ENCOUNTER — Other Ambulatory Visit: Payer: Self-pay

## 2022-12-21 ENCOUNTER — Other Ambulatory Visit (HOSPITAL_BASED_OUTPATIENT_CLINIC_OR_DEPARTMENT_OTHER): Payer: Self-pay

## 2022-12-24 ENCOUNTER — Other Ambulatory Visit (HOSPITAL_BASED_OUTPATIENT_CLINIC_OR_DEPARTMENT_OTHER): Payer: Self-pay

## 2022-12-26 ENCOUNTER — Other Ambulatory Visit (HOSPITAL_BASED_OUTPATIENT_CLINIC_OR_DEPARTMENT_OTHER): Payer: Self-pay

## 2022-12-26 ENCOUNTER — Encounter: Payer: Self-pay | Admitting: Family

## 2022-12-28 ENCOUNTER — Ambulatory Visit (INDEPENDENT_AMBULATORY_CARE_PROVIDER_SITE_OTHER): Payer: 59 | Admitting: Family Medicine

## 2022-12-28 ENCOUNTER — Encounter: Payer: Self-pay | Admitting: Family Medicine

## 2022-12-28 VITALS — BP 116/78 | HR 79 | Temp 99.0°F | Ht 68.0 in | Wt 188.0 lb

## 2022-12-28 DIAGNOSIS — E663 Overweight: Secondary | ICD-10-CM

## 2022-12-28 NOTE — Progress Notes (Signed)
Chief Complaint  Patient presents with   Follow-up    Wegovy    Subjective: Patient is a 52 y.o. female here for f/u.  Patient started [facility] around 6 weeks ago.  She did very well on the 0.25 mg doses.  The 0.5 mg dosage was not available and she was relegated to taking 1 mg weekly.  She has noticed more reflux symptoms since then and started Protonix 1 week ago.  No resolution of symptoms so far.  Diet is improved, portions are down.  She is staying physically active.  No chest pain or shortness of breath.  Past Medical History:  Diagnosis Date   Abnormal perimenopausal bleeding    Dec 29, 2019 STARTED   Anemia    Frequent headaches    TENSION   History of chicken pox AS CHILD    Objective: BP 116/78 (BP Location: Left Arm, Patient Position: Sitting, Cuff Size: Normal)   Pulse 79   Temp 99 F (37.2 C) (Oral)   Ht '5\' 8"'$  (1.727 m)   Wt 188 lb (85.3 kg)   SpO2 97%   BMI 28.59 kg/m  General: Awake, appears stated age Heart: RRR, no LE edema Lungs: CTAB, no rales, wheezes or rhonchi. No accessory muscle use Psych: Age appropriate judgment and insight, normal affect and mood  Assessment and Plan: Overweight (BMI 25.0-29.9)  Chronic, improving.  Continue Wegovy 0.5 mg weekly and then steadily increase.  She will let me know if she would like to stay at a specific strength.  Counseled on diet/exercise.  I will see her in 4 months for physical or as needed. The patient voiced understanding and agreement to the plan.  Bray, DO 12/28/22  4:36 PM

## 2022-12-28 NOTE — Patient Instructions (Signed)
Keep the diet clean and stay active.  Great job so far.  Consider monthly selfie progress photos.  Let us know if you need anything.

## 2023-01-01 ENCOUNTER — Other Ambulatory Visit (HOSPITAL_BASED_OUTPATIENT_CLINIC_OR_DEPARTMENT_OTHER): Payer: Self-pay

## 2023-01-02 ENCOUNTER — Other Ambulatory Visit: Payer: Self-pay | Admitting: Family Medicine

## 2023-01-02 ENCOUNTER — Other Ambulatory Visit (HOSPITAL_BASED_OUTPATIENT_CLINIC_OR_DEPARTMENT_OTHER): Payer: Self-pay

## 2023-01-02 DIAGNOSIS — E669 Obesity, unspecified: Secondary | ICD-10-CM

## 2023-01-02 MED ORDER — SEMAGLUTIDE-WEIGHT MANAGEMENT 2.4 MG/0.75ML ~~LOC~~ SOAJ
2.4000 mg | SUBCUTANEOUS | 0 refills | Status: DC
  Filled 2023-01-02: qty 9, 84d supply, fill #0

## 2023-01-06 ENCOUNTER — Telehealth: Payer: 59 | Admitting: Nurse Practitioner

## 2023-01-06 DIAGNOSIS — R200 Anesthesia of skin: Secondary | ICD-10-CM

## 2023-01-07 ENCOUNTER — Other Ambulatory Visit: Payer: Self-pay

## 2023-01-08 NOTE — Progress Notes (Signed)
I have spent 5 minutes in review of e-visit questionnaire, review and updating patient chart, medical decision making and response to patient.  ° °Demarr Kluever W Marybeth Dandy, NP ° °  °

## 2023-01-14 ENCOUNTER — Other Ambulatory Visit: Payer: Self-pay

## 2023-01-14 ENCOUNTER — Other Ambulatory Visit (HOSPITAL_COMMUNITY): Payer: Self-pay

## 2023-01-17 ENCOUNTER — Encounter: Payer: Self-pay | Admitting: Family

## 2023-01-29 ENCOUNTER — Encounter: Payer: Self-pay | Admitting: Family

## 2023-02-04 ENCOUNTER — Other Ambulatory Visit: Payer: Self-pay | Admitting: Family Medicine

## 2023-02-04 ENCOUNTER — Other Ambulatory Visit (HOSPITAL_BASED_OUTPATIENT_CLINIC_OR_DEPARTMENT_OTHER): Payer: Self-pay

## 2023-02-04 DIAGNOSIS — K2960 Other gastritis without bleeding: Secondary | ICD-10-CM

## 2023-02-04 NOTE — Telephone Encounter (Signed)
OK 

## 2023-02-04 NOTE — Telephone Encounter (Signed)
Ok to refill 

## 2023-02-05 ENCOUNTER — Encounter: Payer: Self-pay | Admitting: Family

## 2023-02-05 ENCOUNTER — Other Ambulatory Visit (HOSPITAL_BASED_OUTPATIENT_CLINIC_OR_DEPARTMENT_OTHER): Payer: Self-pay

## 2023-02-05 MED ORDER — PANTOPRAZOLE SODIUM 40 MG PO TBEC
40.0000 mg | DELAYED_RELEASE_TABLET | Freq: Every day | ORAL | 1 refills | Status: DC
  Filled 2023-02-05: qty 30, 30d supply, fill #0

## 2023-03-25 ENCOUNTER — Encounter: Payer: Self-pay | Admitting: Family

## 2023-05-15 ENCOUNTER — Other Ambulatory Visit: Payer: Self-pay | Admitting: Oncology

## 2023-05-15 DIAGNOSIS — Z006 Encounter for examination for normal comparison and control in clinical research program: Secondary | ICD-10-CM

## 2023-09-02 ENCOUNTER — Encounter: Payer: Self-pay | Admitting: Family Medicine

## 2023-09-02 ENCOUNTER — Ambulatory Visit (INDEPENDENT_AMBULATORY_CARE_PROVIDER_SITE_OTHER): Payer: 59 | Admitting: Family Medicine

## 2023-09-02 VITALS — BP 126/80 | HR 64 | Temp 98.0°F | Resp 16 | Ht 68.0 in | Wt 162.0 lb

## 2023-09-02 DIAGNOSIS — Z0000 Encounter for general adult medical examination without abnormal findings: Secondary | ICD-10-CM | POA: Diagnosis not present

## 2023-09-02 LAB — LIPID PANEL
Cholesterol: 134 mg/dL (ref 0–200)
HDL: 58.9 mg/dL (ref >39.00)
LDL Cholesterol: 64 mg/dL (ref 0–99)
NonHDL: 75.45
Total CHOL/HDL Ratio: 2
Triglycerides: 58 mg/dL (ref 0.0–149.0)
VLDL: 11.6 mg/dL (ref 0.0–40.0)

## 2023-09-02 LAB — CBC
HCT: 36.2 % (ref 36.0–46.0)
Hemoglobin: 12.1 g/dL (ref 12.0–15.0)
MCHC: 33.4 g/dL (ref 30.0–36.0)
MCV: 96.1 fL (ref 78.0–100.0)
Platelets: 312 10*3/uL (ref 150.0–400.0)
RBC: 3.77 Mil/uL — ABNORMAL LOW (ref 3.87–5.11)
RDW: 12.3 % (ref 11.5–15.5)
WBC: 6.1 10*3/uL (ref 4.0–10.5)

## 2023-09-02 LAB — COMPREHENSIVE METABOLIC PANEL
ALT: 9 U/L (ref 0–35)
AST: 12 U/L (ref 0–37)
Albumin: 4.2 g/dL (ref 3.5–5.2)
Alkaline Phosphatase: 54 U/L (ref 39–117)
BUN: 11 mg/dL (ref 6–23)
CO2: 27 meq/L (ref 19–32)
Calcium: 9.3 mg/dL (ref 8.4–10.5)
Chloride: 105 meq/L (ref 96–112)
Creatinine, Ser: 0.81 mg/dL (ref 0.40–1.20)
GFR: 82.43 mL/min (ref >60.00)
Glucose, Bld: 84 mg/dL (ref 70–99)
Potassium: 3.5 meq/L (ref 3.5–5.1)
Sodium: 138 meq/L (ref 135–145)
Total Bilirubin: 0.3 mg/dL (ref 0.2–1.2)
Total Protein: 6.5 g/dL (ref 6.0–8.3)

## 2023-09-02 NOTE — Patient Instructions (Signed)
Give us 2-3 business days to get the results of your labs back.   Keep the diet clean and stay active.  Please get me a copy of your advanced directive form at your convenience.   The Shingrix vaccine (for shingles) is a 2 shot series spaced 2-6 months apart. It can make people feel low energy, achy and almost like they have the flu for 48 hours after injection. 1/5 people can have nausea and/or vomiting. Please plan accordingly when deciding on when to get this shot. Call our office for a nurse visit appointment to get this. The second shot of the series is less severe regarding the side effects, but it still lasts 48 hours.   Let us know if you need anything.  

## 2023-09-02 NOTE — Progress Notes (Signed)
Chief Complaint  Patient presents with   Annual Exam    Annual Exam      Well Woman Ann Owen is here for a complete physical.   Her last physical was >1 year ago.  Current diet: in general, a "healthy" diet. Current exercise: wt lifting, walking. Weight is stable and she denies fatigue out of ordinary. Seatbelt? Yes Advanced directive? Yes  Health Maintenance Pap/HPV- Yes Mammogram- Yes Colon cancer screening-Yes Shingrix- No Tetanus- Yes Hep C screening- Yes HIV screening- Yes  Past Medical History:  Diagnosis Date   Abnormal perimenopausal bleeding    Dec 29, 2019 STARTED   Anemia    Frequent headaches    TENSION   History of chicken pox AS CHILD     Past Surgical History:  Procedure Laterality Date   DILITATION & CURRETTAGE/HYSTROSCOPY WITH NOVASURE ABLATION N/A 05/24/2020   Procedure: HYSTEROSCOPY WITH NOVASURE ABLATION AND RESECTION OF FIBROID WITH MYOSURE;  Surgeon: Willodean Rosenthal, MD;  Location: [facility];  Service: Gynecology;  Laterality: N/A;   NO PAST SURGERIES      Medications  Current Outpatient Medications on File Prior to Visit  Medication Sig Dispense Refill   meloxicam (MOBIC) 7.5 MG tablet Take 7.5 mg by mouth daily.     Multiple Vitamin (MULTIVITAMIN) tablet Take 1 tablet by mouth daily.     Allergies No Known Allergies  Review of Systems: Constitutional:  no unexpected weight changes Eye:  no recent significant change in vision Ear/Nose/Mouth/Throat:  Ears:  no recent change in hearing Nose/Mouth/Throat:  no complaints of nasal congestion, no sore throat Cardiovascular: no chest pain Respiratory:  no shortness of breath Gastrointestinal:  no abdominal pain, no change in bowel habits GU:  Female: negative for dysuria or pelvic pain Musculoskeletal/Extremities:  no pain of the joints Integumentary (Skin/Breast):  no abnormal skin lesions reported Neurologic:  no headaches Endocrine:  denies  fatigue  Exam BP 126/80 (BP Location: Left Arm, Patient Position: Sitting, Cuff Size: Normal)   Pulse 64   Temp 98 F (36.7 C) (Oral)   Resp 16   Ht 5\' 8"  (1.727 m)   Wt 162 lb (73.5 kg)   SpO2 97%   BMI 24.63 kg/m  General:  well developed, well nourished, in no apparent distress Skin:  no significant moles, warts, or growths Head:  no masses, lesions, or tenderness Eyes:  pupils equal and round, sclera anicteric without injection Ears:  canals without lesions, TMs shiny without retraction, no obvious effusion, no erythema Nose:  nares patent, mucosa normal, and no drainage  Throat/Pharynx:  lips and gingiva without lesion; tongue and uvula midline; non-inflamed pharynx; no exudates or postnasal drainage Neck: neck supple without adenopathy, thyromegaly, or masses Lungs:  clear to auscultation, breath sounds equal bilaterally, no respiratory distress Cardio:  regular rate and rhythm, no LE edema Abdomen:  abdomen soft, nontender; bowel sounds normal; no masses or organomegaly Genital: Defer to GYN Musculoskeletal:  symmetrical muscle groups noted without atrophy or deformity Extremities:  no clubbing, cyanosis, or edema, no deformities, no skin discoloration Neuro:  gait normal; deep tendon reflexes normal and symmetric Psych: well oriented with normal range of affect and appropriate judgment/insight  Assessment and Plan  Well adult exam - Plan: CBC, Comprehensive metabolic panel, Lipid panel   Well 52 y.o. female. Counseled on diet and exercise. Advanced directive form requested today.  Shingrix rec'd.  Other orders as above. Follow up in 1 yr. The patient voiced understanding and agreement to  the plan.  Jilda Roche [place], DO 09/02/23 8:45 AM

## 2024-01-08 ENCOUNTER — Encounter: Payer: Self-pay | Admitting: Family

## 2024-01-08 ENCOUNTER — Emergency Department (HOSPITAL_BASED_OUTPATIENT_CLINIC_OR_DEPARTMENT_OTHER)

## 2024-01-08 ENCOUNTER — Emergency Department (HOSPITAL_BASED_OUTPATIENT_CLINIC_OR_DEPARTMENT_OTHER)
Admission: EM | Admit: 2024-01-08 | Discharge: 2024-01-08 | Disposition: A | Discharge status: Home / Self Care | Attending: Emergency Medicine | Admitting: Emergency Medicine

## 2024-01-08 ENCOUNTER — Encounter (HOSPITAL_BASED_OUTPATIENT_CLINIC_OR_DEPARTMENT_OTHER): Payer: Self-pay | Admitting: Emergency Medicine

## 2024-01-08 ENCOUNTER — Other Ambulatory Visit: Payer: Self-pay

## 2024-01-08 ENCOUNTER — Telehealth: Payer: Self-pay

## 2024-01-08 DIAGNOSIS — R079 Chest pain, unspecified: Secondary | ICD-10-CM | POA: Diagnosis not present

## 2024-01-08 DIAGNOSIS — E876 Hypokalemia: Secondary | ICD-10-CM | POA: Insufficient documentation

## 2024-01-08 DIAGNOSIS — R0789 Other chest pain: Secondary | ICD-10-CM | POA: Diagnosis not present

## 2024-01-08 DIAGNOSIS — W010XXA Fall on same level from slipping, tripping and stumbling without subsequent striking against object, initial encounter: Secondary | ICD-10-CM | POA: Diagnosis not present

## 2024-01-08 LAB — CBC
HCT: 35.5 % — ABNORMAL LOW (ref 36.0–46.0)
Hemoglobin: 12.2 g/dL (ref 12.0–15.0)
MCH: 32.5 pg (ref 26.0–34.0)
MCHC: 34.4 g/dL (ref 30.0–36.0)
MCV: 94.7 fL (ref 80.0–100.0)
Platelets: 317 10*3/uL (ref 150–400)
RBC: 3.75 MIL/uL — ABNORMAL LOW (ref 3.87–5.11)
RDW: 11.3 % — ABNORMAL LOW (ref 11.5–15.5)
WBC: 6.9 10*3/uL (ref 4.0–10.5)
nRBC: 0 % (ref 0.0–0.2)

## 2024-01-08 LAB — PREGNANCY, URINE: Preg Test, Ur: NEGATIVE

## 2024-01-08 LAB — BASIC METABOLIC PANEL
Anion gap: 8 (ref 5–15)
BUN: 14 mg/dL (ref 6–20)
CO2: 25 mmol/L (ref 22–32)
Calcium: 9.3 mg/dL (ref 8.9–10.3)
Chloride: 102 mmol/L (ref 98–111)
Creatinine, Ser: 0.8 mg/dL (ref 0.44–1.00)
GFR, Estimated: >60 mL/min (ref >60)
Glucose, Bld: 124 mg/dL — ABNORMAL HIGH (ref 70–99)
Potassium: 3.3 mmol/L — ABNORMAL LOW (ref 3.5–5.1)
Sodium: 135 mmol/L (ref 135–145)

## 2024-01-08 LAB — D-DIMER, QUANTITATIVE: D-Dimer, Quant: <0.27 ug{FEU}/mL (ref 0.00–0.50)

## 2024-01-08 LAB — TROPONIN I (HIGH SENSITIVITY): Troponin I (High Sensitivity): <2 ng/L (ref <18)

## 2024-01-08 MED ORDER — CYCLOBENZAPRINE HCL 10 MG PO TABS
10.0000 mg | ORAL_TABLET | Freq: Two times a day (BID) | ORAL | 0 refills | Status: AC | PRN
  Filled 2024-01-08: qty 20, 10d supply, fill #0

## 2024-01-08 MED ORDER — LIDOCAINE 5 % EX PTCH
1.0000 | MEDICATED_PATCH | CUTANEOUS | 0 refills | Status: AC
  Filled 2024-01-08: qty 30, 30d supply, fill #0

## 2024-01-08 NOTE — ED Triage Notes (Signed)
 C/o left sided CP that started on Monday. States pain is worse with deep breath. Endorses fall on Saturday and landed on left rib cage.

## 2024-01-08 NOTE — Telephone Encounter (Signed)
 Initial Comment Caller states she called earlier for advice. She fell last Monday and she has had aches and pains and bruises that have subsided but she is still having a very sharp pain in her chest. She has an appt tomorrow with a NP at 900 and they wanted her to speak to a triage nurse. Translation No Nurse Assessment Nurse: Hipolito Bayley, RN, Marjean Donna Date/Time (Eastern Time): 01/08/2024 3:27:14 PM Confirm and document reason for call. If symptomatic, describe symptoms. ---Caller states she fell last Monday and she has had aches and pains and bruises that have subsided but she is still having a very sharp pain in her chest. Caller states chest pain is on the upper left side of chest and its a sharp pain when breathing in talking or coughing. Caller states pain feels okay unless she has to do anything. Caller states chest pain became noticeable on Saturday. Caller states pain is constant and she sneezed an hour ago and it made a lot worse. Caller rated pain 7/10 when moving. Does the patient have any new or worsening symptoms? ---Yes Will a triage be completed? ---Yes Related visit to physician within the last 2 weeks? ---No Does the PT have any chronic conditions? (i.e. diabetes, asthma, this includes High risk factors for pregnancy, etc.) ---No Is the patient pregnant or possibly pregnant? (Ask all females between the ages of 47-55) ---No Is this a behavioral health or substance abuse call? ---No PLEASE NOTE: All timestamps contained within this report are represented as [place] Standard Time. CONFIDENTIALTY NOTICE: This fax transmission is intended only for the addressee. It contains information that is legally privileged, confidential or otherwise protected from use or disclosure. If you are not the intended recipient, you are strictly prohibited from reviewing, disclosing, copying using or disseminating any of this information or taking any action in reliance on or regarding this  information. If you have received this fax in error, please notify us immediately by telephone so that we can arrange for its return to [place]. Phone: [phone], Toll-Free: [phone], Fax: [phone] [facility] [birth year] Page: 2 of 2 CallId: [id number] Guidelines Guideline Title Affirmed Question Affirmed Notes Nurse Date/Time Lamount Cohen Time) Chest Injury Sounds like a serious injury to the triager Andreas Ohm 01/08/2024 3:30:41 PM Disp. Time Lamount Cohen Time) Disposition Final User 01/08/2024 3:25:53 PM Send to Urgent Geanie Logan 01/08/2024 3:36:14 PM Go to ED Now Yes Hipolito Bayley, RN, Marjean Donna Final Disposition 01/08/2024 3:36:14 PM Go to ED Now Yes Hipolito Bayley, RN, Deboraha Sprang Disagree/Comply Comply Caller Understands Yes PreDisposition InappropriateToAsk Care Advice Given Per Guideline GO TO ED NOW: * You need to be seen in the Emergency Department. * Leave now. Drive carefully. CARE ADVICE given per Chest Injury (Adult) guideline. Comments User: Molli Hazard, RN Date/Time Lamount Cohen Time): 01/08/2024 3:33:06 PM Caller states she fell on a walk and tripped on a wire last Sunday. User: Molli Hazard, RN Date/Time Lamount Cohen Time): 01/08/2024 3:38:00 PM Caller states she fell on her hands but pressure from fall may have injured her chest. Caller states she had pain all over and cuts on her hand when fall happened. Caller states chest pain has been constant since Saturday for a few days now. Caller denies difficulty breathing or cardiac risk factors Referrals [facility] - ED

## 2024-01-08 NOTE — ED Provider Notes (Signed)
 [place] [facility] Provider Note   CSN: [id number] Arrival date & time: 01/08/24  1627     History  Chief Complaint  Patient presents with   Chest Pain    Ann Owen is a 53 y.o. female.   Chest Pain   53 year old female presents emergency department with complaints of left-sided chest pain.  States that it began 9 to 10 days ago.  States that she tripped and fell on her left chest wall.  Denies trauma to head, with the knees, LOC.  States she did have some scratches on her hands as well as her legs but has not had continued pain in these areas.  Reports left-sided chest pain worsened with taking a deep breath.  Tried again with her primary care but was unable to get an appointment prompting visit to the emergency department.  Has taken no medication for her symptoms.  States that hurts with coughing, laughing or with deep inspiration.  Past medical history significant for anemia, frequent headaches  Home Medications Prior to Admission medications   Medication Sig Start Date End Date Taking? Authorizing Provider  cyclobenzaprine (FLEXERIL) 10 MG tablet Take 1 tablet (10 mg total) by mouth 2 (two) times daily as needed for muscle spasms. 01/08/24  Yes Sherian Maroon A, PA  lidocaine (LIDODERM) 5 % Place 1 patch onto the skin daily. Remove & Discard patch within 12 hours or as directed by MD 01/08/24  Yes Sherian Maroon A, PA  meloxicam (MOBIC) 7.5 MG tablet Take 7.5 mg by mouth daily.    [provider]  Multiple Vitamin (MULTIVITAMIN) tablet Take 1 tablet by mouth daily.    [provider]      Allergies    Patient has no known allergies.    Review of Systems   Review of Systems  Cardiovascular:  Positive for chest pain.  All other systems reviewed and are negative.   Physical Exam Updated Vital Signs BP 131/64 (BP Location: Right Arm)   Pulse 66   Temp 98.6 F (37 C)   Resp 18   Ht 5\' 8"  (1.727 m)   Wt 73.5  kg   SpO2 100%   BMI 24.63 kg/m  Physical Exam Vitals and nursing note reviewed.  Constitutional:      General: She is not in acute distress.    Appearance: She is well-developed.  HENT:     Head: Normocephalic and atraumatic.  Eyes:     Conjunctiva/sclera: Conjunctivae normal.  Cardiovascular:     Rate and Rhythm: Normal rate and regular rhythm.     Pulses: Normal pulses.     Heart sounds: No murmur heard. Pulmonary:     Effort: Pulmonary effort is normal. No respiratory distress.     Breath sounds: Normal breath sounds. No wheezing, rhonchi or rales.     Comments: Left anterior chest wall pain.  No obvious flail chest. Chest:     Chest wall: Tenderness present.  Abdominal:     Palpations: Abdomen is soft.     Tenderness: There is no abdominal tenderness.  Musculoskeletal:        General: No swelling.     Cervical back: Neck supple.     Right lower leg: No edema.     Left lower leg: No edema.  Skin:    General: Skin is warm and dry.     Capillary Refill: Capillary refill takes less than 2 seconds.  Neurological:  Mental Status: She is alert.  Psychiatric:        Mood and Affect: Mood normal.     ED Results / Procedures / Treatments   Labs (all labs ordered are listed, but only abnormal results are displayed) Labs Reviewed  BASIC METABOLIC PANEL - Abnormal; Notable for the following components:      Result Value   Potassium 3.3 (*)    Glucose, Bld 124 (*)    All other components within normal limits  CBC - Abnormal; Notable for the following components:   RBC 3.75 (*)    HCT 35.5 (*)    RDW 11.3 (*)    All other components within normal limits  PREGNANCY, URINE  D-DIMER, QUANTITATIVE  TROPONIN I (HIGH SENSITIVITY)    EKG EKG Interpretation Date/Time:  Wednesday January 08 2024 16:38:58 EST Ventricular Rate:  61 PR Interval:  184 QRS Duration:  84 QT Interval:  418 QTC Calculation: 420 R Axis:   30  Text Interpretation: Normal sinus rhythm  Confirmed by Virgina Norfolk [phone]) on 01/08/2024 4:42:07 PM  Radiology No results found.  Procedures Procedures    Medications Ordered in ED Medications - No data to display  ED Course/ Medical Decision Making/ A&P                                 Medical Decision Making Amount and/or Complexity of Data Reviewed Labs: ordered. Radiology: ordered.  Risk Prescription drug management.   This patient presents to the ED for concern of chest pain, this involves an extensive number of treatment options, and is a complaint that carries with it a high risk of complications and morbidity.  The differential diagnosis includes ACS, PE, pneumothorax, pericarditis/myocarditis/tamponade, musculoskeletal, fracture, other   Co morbidities that complicate the patient evaluation  See HPI   Additional history obtained:  Additional history obtained from EMR External records from outside source obtained and reviewed including hospital records   Lab Tests:  I Ordered, and personally interpreted labs.  The pertinent results include: No leukocytosis.  No evidence of anemia.  Platelets within range.  Mild hypokalemia 3.3 but otherwise, electrolytes within normal limits.  No renal dysfunction.  Troponin of less than 2.  D-dimer negative.  Urine pregnancy negative.   Imaging Studies ordered:  I ordered imaging studies including chest x-ray I independently visualized and interpreted imaging which showed no acute cardiopulmonary abnormality I agree with the radiologist interpretation   Cardiac Monitoring: / EKG:  The patient was maintained on a cardiac monitor.  I personally viewed and interpreted the cardiac monitored which showed an underlying rhythm of: Sinus rhythm   Consultations Obtained:  N/a   Problem List / ED Course / Critical interventions / Medication management  Chest wall pain Reevaluation of the patient showed that the patient stayed the same I have reviewed the  patients home medicines and have made adjustments as needed   Social Determinants of Health:  Denies tobacco, licit drug use.   Test / Admission - Considered:  Chest wall pain Vitals signs within normal range and stable throughout visit. Laboratory/imaging studies significant for: See above 53 year old female presents emergency department with complaints of left-sided chest pain.  Chest pain began after she had a mechanical fall around 9 days ago and landed on the front left side of her chest.  Exam, reproducible tenderness inside area; no evidence of flail chest.  Workup from triage area was reassuring  with negative troponin, lack of acute ischemic change on EKG; low suspicion for ACS.  Patient with negative D-dimer; low suspicion for PE.  Chest x-ray without obvious pneumothorax, pleural effusion, pulmonary vas congestion, pneumonia.  No obvious rib fracture appreciated on chest x-ray imaging.  Suspect muscular skeletal etiology of patient's symptoms.  Recommend continued use of incentive spirometry as provided in the ED as well as Tylenol/NSAIDs and topical lidocaine patches.  Follow-up with PCP recommended for reevaluation.  Treatment plan discussed at length with patient and she acknowledged understanding was agreeable to said plan.  Patient overall well-appearing, afebrile in no acute distress. Worrisome signs and symptoms were discussed with the patient, and the patient acknowledged understanding to return to the ED if noticed. Patient was stable upon discharge.          Final Clinical Impression(s) / ED Diagnoses Final diagnoses:  Chest wall pain    Rx / DC Orders      Peter Garter, PA 01/08/24 2055    Virgina Norfolk, DO 01/08/24 2228

## 2024-01-08 NOTE — Discharge Instructions (Addendum)
 As discussed, your workup today was overall reassuring.  Your heart enzyme appeared normal so does not appear like you are having a heart attack.  The enzyme for blood clot in the lung was also normal so I do not think that you have a blood clot in the lung.  Your chest x-ray did not show collapsed lung or obvious rib fracture. Will recommend use of Tylenol/Motrin for your pain.  Will also send a muscle laxer to use as needed but this medicine can cause drowsiness so please be careful taking this medication.  Recommend follow-up with your primary care for reassessment.  Please do not hesitate to return if the worrisome signs and symptoms we discussed become apparent.

## 2024-01-09 ENCOUNTER — Encounter: Payer: Self-pay | Admitting: Family

## 2024-01-09 ENCOUNTER — Other Ambulatory Visit (HOSPITAL_BASED_OUTPATIENT_CLINIC_OR_DEPARTMENT_OTHER): Payer: Self-pay

## 2024-01-09 ENCOUNTER — Ambulatory Visit: Admitting: Family Medicine

## 2024-01-14 ENCOUNTER — Other Ambulatory Visit (HOSPITAL_BASED_OUTPATIENT_CLINIC_OR_DEPARTMENT_OTHER): Payer: Self-pay

## 2024-02-19 ENCOUNTER — Emergency Department (HOSPITAL_BASED_OUTPATIENT_CLINIC_OR_DEPARTMENT_OTHER)
Admission: EM | Admit: 2024-02-19 | Discharge: 2024-02-19 | Disposition: A | Discharge status: Home / Self Care | Attending: Emergency Medicine | Admitting: Emergency Medicine

## 2024-02-19 ENCOUNTER — Emergency Department (HOSPITAL_BASED_OUTPATIENT_CLINIC_OR_DEPARTMENT_OTHER)

## 2024-02-19 ENCOUNTER — Other Ambulatory Visit: Payer: Self-pay

## 2024-02-19 ENCOUNTER — Encounter (HOSPITAL_BASED_OUTPATIENT_CLINIC_OR_DEPARTMENT_OTHER): Payer: Self-pay | Admitting: Emergency Medicine

## 2024-02-19 DIAGNOSIS — R202 Paresthesia of skin: Secondary | ICD-10-CM | POA: Insufficient documentation

## 2024-02-19 DIAGNOSIS — R791 Abnormal coagulation profile: Secondary | ICD-10-CM | POA: Insufficient documentation

## 2024-02-19 DIAGNOSIS — R200 Anesthesia of skin: Secondary | ICD-10-CM

## 2024-02-19 DIAGNOSIS — G459 Transient cerebral ischemic attack, unspecified: Secondary | ICD-10-CM | POA: Diagnosis not present

## 2024-02-19 DIAGNOSIS — R9431 Abnormal electrocardiogram [ECG] [EKG]: Secondary | ICD-10-CM | POA: Diagnosis not present

## 2024-02-19 DIAGNOSIS — Z79899 Other long term (current) drug therapy: Secondary | ICD-10-CM | POA: Diagnosis not present

## 2024-02-19 DIAGNOSIS — R29818 Other symptoms and signs involving the nervous system: Secondary | ICD-10-CM | POA: Diagnosis not present

## 2024-02-19 LAB — DIFFERENTIAL
Abs Immature Granulocytes: 0.03 10*3/uL (ref 0.00–0.07)
Basophils Absolute: 0 10*3/uL (ref 0.0–0.1)
Basophils Relative: 0 %
Eosinophils Absolute: 0 10*3/uL (ref 0.0–0.5)
Eosinophils Relative: 0 %
Immature Granulocytes: 0 %
Lymphocytes Relative: 10 %
Lymphs Abs: 1 10*3/uL (ref 0.7–4.0)
Monocytes Absolute: 0.5 10*3/uL (ref 0.1–1.0)
Monocytes Relative: 5 %
Neutro Abs: 8.3 10*3/uL — ABNORMAL HIGH (ref 1.7–7.7)
Neutrophils Relative %: 85 %

## 2024-02-19 LAB — COMPREHENSIVE METABOLIC PANEL WITH GFR
ALT: 14 U/L (ref 0–44)
AST: 19 U/L (ref 15–41)
Albumin: 4.3 g/dL (ref 3.5–5.0)
Alkaline Phosphatase: 61 U/L (ref 38–126)
Anion gap: 9 (ref 5–15)
BUN: 14 mg/dL (ref 6–20)
CO2: 25 mmol/L (ref 22–32)
Calcium: 9.3 mg/dL (ref 8.9–10.3)
Chloride: 106 mmol/L (ref 98–111)
Creatinine, Ser: 0.74 mg/dL (ref 0.44–1.00)
GFR, Estimated: >60 mL/min (ref >60)
Glucose, Bld: 123 mg/dL — ABNORMAL HIGH (ref 70–99)
Potassium: 3.9 mmol/L (ref 3.5–5.1)
Sodium: 140 mmol/L (ref 135–145)
Total Bilirubin: 0.5 mg/dL (ref 0.0–1.2)
Total Protein: 7.5 g/dL (ref 6.5–8.1)

## 2024-02-19 LAB — RAPID URINE DRUG SCREEN, HOSP PERFORMED
Amphetamines: NOT DETECTED
Barbiturates: NOT DETECTED
Benzodiazepines: NOT DETECTED
Cocaine: NOT DETECTED
Opiates: NOT DETECTED
Tetrahydrocannabinol: NOT DETECTED

## 2024-02-19 LAB — URINALYSIS, ROUTINE W REFLEX MICROSCOPIC
Bilirubin Urine: NEGATIVE
Glucose, UA: NEGATIVE mg/dL
Hgb urine dipstick: NEGATIVE
Ketones, ur: NEGATIVE mg/dL
Leukocytes,Ua: NEGATIVE
Nitrite: NEGATIVE
Protein, ur: NEGATIVE mg/dL
Specific Gravity, Urine: 1.01 (ref 1.005–1.030)
pH: 5.5 (ref 5.0–8.0)

## 2024-02-19 LAB — CBG MONITORING, ED: Glucose-Capillary: 116 mg/dL — ABNORMAL HIGH (ref 70–99)

## 2024-02-19 LAB — CBC
HCT: 36.9 % (ref 36.0–46.0)
Hemoglobin: 12.7 g/dL (ref 12.0–15.0)
MCH: 32.2 pg (ref 26.0–34.0)
MCHC: 34.4 g/dL (ref 30.0–36.0)
MCV: 93.7 fL (ref 80.0–100.0)
Platelets: 328 10*3/uL (ref 150–400)
RBC: 3.94 MIL/uL (ref 3.87–5.11)
RDW: 11.4 % — ABNORMAL LOW (ref 11.5–15.5)
WBC: 9.9 10*3/uL (ref 4.0–10.5)
nRBC: 0 % (ref 0.0–0.2)

## 2024-02-19 LAB — ETHANOL: Alcohol, Ethyl (B): <10 mg/dL (ref <10)

## 2024-02-19 LAB — PREGNANCY, URINE: Preg Test, Ur: NEGATIVE

## 2024-02-19 LAB — PROTIME-INR
INR: 1 (ref 0.8–1.2)
Prothrombin Time: 13.2 s (ref 11.4–15.2)

## 2024-02-19 LAB — APTT: aPTT: 30 s (ref 24–36)

## 2024-02-19 MED ORDER — METOCLOPRAMIDE HCL 5 MG/ML IJ SOLN
10.0000 mg | Freq: Once | INTRAMUSCULAR | Status: AC
  Administered 2024-02-19: 10 mg via INTRAVENOUS
  Filled 2024-02-19: qty 2

## 2024-02-19 MED ORDER — KETOROLAC TROMETHAMINE 15 MG/ML IJ SOLN
15.0000 mg | Freq: Once | INTRAMUSCULAR | Status: AC
  Administered 2024-02-19: 15 mg via INTRAVENOUS
  Filled 2024-02-19: qty 1

## 2024-02-19 NOTE — Consult Note (Addendum)
 [facility] Telemedicine Consult   Requesting Provider: Dr Andria Meuse Consult Participants: Dr. Marthe Patch, Telespecialist RN Olegario Messier   bedside RN Arline Asp Location of the provider: [facility] Location of the patient: [facility] Owen[facility]  This consult was provided via telemedicine with 2-way video and audio communication. The patient/family was informed that care would be provided in this way and agreed to receive care in this manner.   Chief Complaint: Left face numbness  HPI: 53 year old with history of anemia, no other past medical history, presented to the emergency department for sudden onset of left facial numbness.  She reports that this started suddenly right at 10:30 AM when she was at [facility] noticed some numbness on the left shin.  This then started to go up on the left cheek and around the left eyelid.  She also became shaky and tremulous all over.  She has had similar presentation with her anemia in the past but the one-sided symptoms were different than previous. She came in for evaluation of the symptoms due to concern for strokelike features. She had a mild headache yesterday but is not someone who gets migraines.  As a young adult, she had stress headaches but has not had headaches for a while.   Past Medical History:  Diagnosis Date   Abnormal perimenopausal bleeding    Dec 29, 2019 STARTED   Anemia    Frequent headaches    TENSION   History of chicken pox AS CHILD    No current facility-administered medications for this encounter.  Current Outpatient Medications:    cyclobenzaprine (FLEXERIL) 10 MG tablet, Take 1 tablet (10 mg total) by mouth 2 (two) times daily as needed for muscle spasms., Disp: 20 tablet, Rfl: 0   lidocaine (LIDODERM) 5 %, Place 1 patch onto the skin daily. Remove & Discard patch within 12 hours or as directed by MD., Disp: 30 patch, Rfl: 0   meloxicam (MOBIC) 7.5 MG tablet, Take 7.5 mg by mouth daily., Disp: , Rfl:    Multiple Vitamin (MULTIVITAMIN) tablet,  Take 1 tablet by mouth daily., Disp: , Rfl:     LKW: 10:30 AM IV thrombolysis given?: No, too mild to treat IR Thrombectomy? No, too mild to treat Modified Rankin Scale: 0-Completely asymptomatic and back to baseline post- stroke Time of teleneurologist evaluation: 12:45 PM  Exam: Vitals:   02/19/24 1234  BP: 125/75  Pulse: 71  Resp: 15  Temp: 99.1 F (37.3 C)  SpO2: 100%    General: Awake alert oriented x 3.  The only deficit on the neurological exam is left facial sensation difference in comparison to the right    NIHSS 1A: Level of Consciousness - 0 1B: Ask Month and Age - 0 1C: 'Blink Eyes' & 'Squeeze Hands' - 0 2: Test Horizontal Extraocular Movements - 0 3: Test Visual Fields - 0 4: Test Facial Palsy - 0 5A: Test Left Arm Motor Drift - 0 5B: Test Right Arm Motor Drift - 0 6A: Test Left Leg Motor Drift - 0 6B: Test Right Leg Motor Drift - 0 7: Test Limb Ataxia - 0 8: Test Sensation - 1 9: Test Language/Aphasia- 00 10: Test Dysarthria - 0 11: Test Extinction/Inattention - 0 NIHSS score: 1   Imaging Reviewed: CT head-aspects 10.  No bleed.  Emergent vessel imaging not performed due to exam not consistent with LVO.  Labs reviewed in epic and pertinent values follow: CBC    Component Value Date/Time   WBC 9.9 02/19/2024 1238   RBC 3.94  02/19/2024 1238   HGB 12.7 02/19/2024 1238   HGB 10.5 (L) 03/25/2020 1046   HCT 36.9 02/19/2024 1238   PLT 328 02/19/2024 1238   PLT 314 03/25/2020 1046   MCV 93.7 02/19/2024 1238   MCH 32.2 02/19/2024 1238   MCHC 34.4 02/19/2024 1238   RDW 11.4 (L) 02/19/2024 1238   LYMPHSABS 1.0 02/19/2024 1238   MONOABS 0.5 02/19/2024 1238   EOSABS 0.0 02/19/2024 1238   BASOSABS 0.0 02/19/2024 1238   CMP     Component Value Date/Time   NA 135 01/08/2024 1637   K 3.3 (L) 01/08/2024 1637   CL 102 01/08/2024 1637   CO2 25 01/08/2024 1637   GLUCOSE 124 (H) 01/08/2024 1637   BUN 14 01/08/2024 1637   CREATININE 0.80 01/08/2024  1637   CREATININE 0.81 02/24/2020 1051   CREATININE 0.79 07/02/2016 1010   CALCIUM 9.3 01/08/2024 1637   PROT 6.5 09/02/2023 0841   ALBUMIN 4.2 09/02/2023 0841   AST 12 09/02/2023 0841   AST 9 (L) 02/24/2020 1051   ALT 9 09/02/2023 0841   ALT 6 02/24/2020 1051   ALKPHOS 54 09/02/2023 0841   BILITOT 0.3 09/02/2023 0841   BILITOT 0.2 (L) 02/24/2020 1051   GFRNONAA >60 01/08/2024 1637   GFRNONAA >60 02/24/2020 1051   GFRNONAA 89 07/02/2016 1010   GFRAA >60 02/24/2020 1051   GFRAA >89 07/02/2016 1010     Assessment: 53 year old with no significant past medical history presenting with sudden onset of left facial numbness that started around the left shin and ascended up to involve the left cheek and left eyelid.  No prior history of strokes. A small sensory infarct is possible but symptoms are very mild to treat.  Discussed the risks benefits and alternatives of IV TNKase and she declined. MRI facilities available at the freestanding ER today-ordered stat MRI for further evaluation.  Impression: Strokelike symptoms of facial numbness, other possibilities include complicated migraine versus nonspecific paresthesias although she has no history of either of them in the past.  Recommendations:  Stat MRI-further recommendations based on imaging studies. May treat with migraine cocktail if the MRI of the brain is negative for acute process Check B12 TSH Will update the EDP once the MRI is available. Preliminary plan relayed to the EDP via secure chat    Addendum 2:04 PM MRI brain completed and reviewed-no evidence of acute stroke Can give a trial of IV fluids and migraine cocktail and follow-up with outpatient neurology as needed. Plan relayed to Dr. Florie Husband -- Tona Francis, MD Neurologist [facility] Pager: [phone]

## 2024-02-19 NOTE — ED Provider Notes (Signed)
  EMERGENCY DEPARTMENT AT [facility] Provider Note  CSN: [id number] Arrival date & time: 02/19/24 1227  Chief Complaint(s) No chief complaint on file.  HPI Ann Owen is a 53 y.o. female who is here today after she had some numbness on the left side of her face at 10:30 AM this morning that persisted.  Patient with no visual disturbance, facial droop or trouble with her speech.  Patient works at [employer], knew that facial numbness was a possible symptom of stroke and came to the ED for evaluation.   Past Medical History Past Medical History:  Diagnosis Date   Abnormal perimenopausal bleeding    Dec 29, 2019 STARTED   Anemia    Frequent headaches    TENSION   History of chicken pox AS CHILD   Patient Active Problem List   Diagnosis Date Noted   Obesity (BMI 30-39.9) 11/09/2022   Abnormal perimenopausal bleeding 05/24/2020   IDA (iron deficiency anemia) 02/29/2020   Home Medication(s) Prior to Admission medications   Medication Sig Start Date End Date Taking? Authorizing Provider  cyclobenzaprine (FLEXERIL) 10 MG tablet Take 1 tablet (10 mg total) by mouth 2 (two) times daily as needed for muscle spasms. 01/08/24   Sherian Maroon A, PA  lidocaine (LIDODERM) 5 % Place 1 patch onto the skin daily. Remove & Discard patch within 12 hours or as directed by MD. 01/08/24   Peter Garter, PA  meloxicam (MOBIC) 7.5 MG tablet Take 7.5 mg by mouth daily.    [provider]  Multiple Vitamin (MULTIVITAMIN) tablet Take 1 tablet by mouth daily.    [provider]                                                                                                                                    Past Surgical History Past Surgical History:  Procedure Laterality Date   DILITATION & CURRETTAGE/HYSTROSCOPY WITH NOVASURE ABLATION N/A 05/24/2020   Procedure: HYSTEROSCOPY WITH NOVASURE ABLATION AND RESECTION OF FIBROID WITH MYOSURE;  Surgeon:  Willodean Rosenthal, MD;  Location: [facility] [place];  Service: Gynecology;  Laterality: N/A;   NO PAST SURGERIES     Family History Family History  Problem Relation Age of Onset   Hyperlipidemia Mother    Arthritis Father    Cancer Maternal Grandmother        Bone   Colon cancer Neg Hx    Esophageal cancer Neg Hx    Stomach cancer Neg Hx    Rectal cancer Neg Hx     Social History Social History   Tobacco Use   Smoking status: Never   Smokeless tobacco: Never  Vaping Use   Vaping status: Never Used  Substance Use Topics   Alcohol use: No   Drug use: No   Allergies Patient has no known allergies.  Review of Systems Review of Systems  Physical Exam Vital Signs  I have reviewed the triage vital signs BP 125/75 (BP Location: Right Arm)   Pulse 80   Temp 99.1 F (37.3 C) (Oral)   Resp 17   Ht 5\' 8"  (1.727 m)   Wt 75.3 kg   SpO2 99%   BMI 25.24 kg/m   Physical Exam Vitals and nursing note reviewed.  Neurological:     Mental Status: She is alert.     Comments: Numbness over the left side of the forehead, left cheek and left side of the face.  Cranial nerves II through XII otherwise intact.  No bilateral upper or lower extremity weakness or diminished sensation.  Patient with a normal gait.  Extraocular muscles intact.     ED Results and Treatments Labs (all labs ordered are listed, but only abnormal results are displayed) Labs Reviewed  CBC - Abnormal; Notable for the following components:      Result Value   RDW 11.4 (*)    All other components within normal limits  DIFFERENTIAL - Abnormal; Notable for the following components:   Neutro Abs 8.3 (*)    All other components within normal limits  COMPREHENSIVE METABOLIC PANEL WITH GFR - Abnormal; Notable for the following components:   Glucose, Bld 123 (*)    All other components within normal limits  CBG MONITORING, ED - Abnormal; Notable for the following components:   Glucose-Capillary  116 (*)    All other components within normal limits  PROTIME-INR  APTT  RAPID URINE DRUG SCREEN, HOSP PERFORMED  URINALYSIS, ROUTINE W REFLEX MICROSCOPIC  PREGNANCY, URINE  ETHANOL                                                                                                                          Radiology MR BRAIN WO CONTRAST Result Date: 02/19/2024 CLINICAL DATA:  Neuro deficit, acute, stroke suspected. Left facial numbness EXAM: MRI HEAD WITHOUT CONTRAST TECHNIQUE: Multiplanar, multiecho pulse sequences of the brain and surrounding structures were obtained without intravenous contrast. COMPARISON:  Head CT same day FINDINGS: Brain: Diffusion imaging does not show any acute or subacute infarction or other cause of restricted diffusion. No focal abnormality affects the brainstem or cerebellum. Cerebral hemispheres are similarly normal without evidence of old or acute stroke, mass, hemorrhage, hydrocephalus or extra-axial collection. Vascular: Major vessels at the base of the brain show flow. Skull and upper cervical spine: Negative Sinuses/Orbits: Clear/normal Other: None IMPRESSION: Normal examination. No abnormality seen to explain the clinical presentation. Electronically Signed   By: Bettylou Brunner M.D.   On: 02/19/2024 13:49   CT HEAD CODE STROKE WO CONTRAST Result Date: 02/19/2024 CLINICAL DATA:  Code stroke. Transient ischemic attack. Code stroke. EXAM: CT HEAD WITHOUT CONTRAST TECHNIQUE: Contiguous axial images were obtained from the base of the skull through the vertex without intravenous contrast. RADIATION DOSE REDUCTION: This exam was performed according to the departmental dose-optimization program which includes automated exposure control, adjustment of the mA and/or kV according to patient  size and/or use of iterative reconstruction technique. COMPARISON:  None Available. FINDINGS: Brain: Normal appearance without evidence of old or acute stroke, mass, hemorrhage, hydrocephalus or  extra-axial collection. Vascular: No abnormal vascular finding. Skull: Normal Sinuses/Orbits: Clear/normal Other: None ASPECTS (Alberta Stroke Program Early CT Score) - Ganglionic level infarction (caudate, lentiform nuclei, internal capsule, insula, M1-M3 cortex): 7 - Supraganglionic infarction (M4-M6 cortex): 3 Total score (0-10 with 10 being normal): 10 IMPRESSION: 1. Normal head CT. 2. Aspects is 10. 3. These results were called by telephone at the time of interpretation on 02/19/2024 at 12:54 pm to provider [facility] , who verbally acknowledged these results. Electronically Signed   By: Paulina Fusi M.D.   On: 02/19/2024 12:59    Pertinent labs & imaging results that were available during my care of the patient were reviewed by me and considered in my medical decision making (see MDM for details).  Medications Ordered in ED Medications  metoCLOPramide (REGLAN) injection 10 mg (10 mg Intravenous Given 02/19/24 1415)  ketorolac (TORADOL) 15 MG/ML injection 15 mg (15 mg Intravenous Given 02/19/24 1415)                                                                                                                                     Procedures Procedures  (including critical care time)  Medical Decision Making / ED Course   This patient presents to the ED for concern of facial numbness, this involves an extensive number of treatment options, and is a complaint that carries with it a high risk of complications and morbidity.  The differential diagnosis includes stroke, electrolyte abnormalities, complex migraine.  MDM: 53 year old female presenting to the emergency room with left-sided facial weakness within a 4-hour window.  I activated the patient as a possible stroke although the patient had minor symptoms and likely would not need lytics.  Patient was evaluated by teleneuro.  CT negative.  MRI ordered that was also negative.  Neurology recommending treatment with medications for  migraine cocktail which were provided for the patient.  Prior to receiving medications she started report that she felt the numbness on the other side of her face as well.  Felt better with Reglan.  Will have the patient follow-up with her PCP.  Labs reviewed, no significant abnormalities.   Additional history obtained: -Additional history obtained from  -External records from outside source obtained and reviewed including: Chart review including previous notes, labs, imaging, consultation notes   Lab Tests: -I ordered, reviewed, and interpreted labs.   The pertinent results include:   Labs Reviewed  CBC - Abnormal; Notable for the following components:      Result Value   RDW 11.4 (*)    All other components within normal limits  DIFFERENTIAL - Abnormal; Notable for the following components:   Neutro Abs 8.3 (*)    All other components within normal limits  COMPREHENSIVE METABOLIC PANEL WITH GFR -  Abnormal; Notable for the following components:   Glucose, Bld 123 (*)    All other components within normal limits  CBG MONITORING, ED - Abnormal; Notable for the following components:   Glucose-Capillary 116 (*)    All other components within normal limits  PROTIME-INR  APTT  RAPID URINE DRUG SCREEN, HOSP PERFORMED  URINALYSIS, ROUTINE W REFLEX MICROSCOPIC  PREGNANCY, URINE  ETHANOL      EKG my independent review of the patient's EKG shows no ST segment depressions or elevations, no T wave inversions, no evidence of acute ischemia.  EKG Interpretation Date/Time:    Ventricular Rate:    PR Interval:    QRS Duration:    QT Interval:    QTC Calculation:   R Axis:      Text Interpretation:           Imaging Studies ordered: I ordered imaging studies including CT head, MRI I independently visualized and interpreted imaging. I agree with the radiologist interpretation   Medicines ordered and prescription drug management: Meds ordered this encounter  Medications    metoCLOPramide (REGLAN) injection 10 mg   ketorolac (TORADOL) 15 MG/ML injection 15 mg    -I have reviewed the patients home medicines and have made adjustments as needed  Critical interventions Management of possible stroke  Cardiac Monitoring: The patient was maintained on a cardiac monitor.  I personally viewed and interpreted the cardiac monitored which showed an underlying rhythm of: Normal sinus rhythm  Social Determinants of Health:  Factors impacting patients care include:    Reevaluation: After the interventions noted above, I reevaluated the patient and found that they have :improved  Co morbidities that complicate the patient evaluation  Past Medical History:  Diagnosis Date   Abnormal perimenopausal bleeding    Dec 29, 2019 STARTED   Anemia    Frequent headaches    TENSION   History of chicken pox AS CHILD      Dispostion: I considered admission for this patient, however the patient's reassuring workup, neurology outpatient follow-up is preferable.     Final Clinical Impression(s) / ED Diagnoses Final diagnoses:  Facial numbness     @PCDICTATION @    Afton Horse T, DO 02/19/24 1519

## 2024-02-19 NOTE — Discharge Instructions (Addendum)
 Work that was done today was normal.  Your head CT and your MRI were negative.  It does not appear that you had a stroke today!  I would recommend following up with your primary care doctor for repeat blood work.  I have sent a referral to neurology and they should contact you within the next 1 to 2 weeks.  Return to the emergency room if you develop any of the other symptoms that you know are possible to see in a stroke.

## 2024-02-19 NOTE — ED Notes (Signed)
 1237  telestroke alerted.  LKW 1030. Pt with c/o left facial numbness that started in the chin area and moved up to the eyebrow area.  Also c/o bilaterally hand tremors that pt reports she has had in the past.    1241 pt to CT  1244 Dr. Bonnita Buttner paged  1245  Dr. Bonnita Buttner on camera  1249  pt back from CT.  mRS 0, NIH 1 for sensation

## 2024-02-19 NOTE — ED Triage Notes (Addendum)
 Around 1030 started to have facial numbness in chin that goes up to eye on that side started to have tremors in both hands  , VAN is neg code stroke  called and pt  to CT and back

## 2024-04-07 DIAGNOSIS — H524 Presbyopia: Secondary | ICD-10-CM | POA: Diagnosis not present

## 2024-04-07 DIAGNOSIS — Z135 Encounter for screening for eye and ear disorders: Secondary | ICD-10-CM | POA: Diagnosis not present

## 2024-05-21 ENCOUNTER — Other Ambulatory Visit (HOSPITAL_BASED_OUTPATIENT_CLINIC_OR_DEPARTMENT_OTHER): Payer: Self-pay

## 2024-05-21 MED ORDER — AMOXICILLIN 500 MG PO CAPS
500.0000 mg | ORAL_CAPSULE | Freq: Four times a day (QID) | ORAL | 0 refills | Status: AC
  Filled 2024-05-21: qty 28, 7d supply, fill #0

## 2024-05-29 ENCOUNTER — Other Ambulatory Visit (HOSPITAL_BASED_OUTPATIENT_CLINIC_OR_DEPARTMENT_OTHER): Payer: Self-pay

## 2024-07-03 ENCOUNTER — Ambulatory Visit
Admission: EM | Admit: 2024-07-03 | Discharge: 2024-07-03 | Disposition: A | Discharge status: Home / Self Care | Attending: Family Medicine | Admitting: Family Medicine

## 2024-07-03 ENCOUNTER — Ambulatory Visit (INDEPENDENT_AMBULATORY_CARE_PROVIDER_SITE_OTHER)

## 2024-07-03 ENCOUNTER — Other Ambulatory Visit (HOSPITAL_BASED_OUTPATIENT_CLINIC_OR_DEPARTMENT_OTHER): Payer: Self-pay

## 2024-07-03 DIAGNOSIS — M79672 Pain in left foot: Secondary | ICD-10-CM | POA: Diagnosis not present

## 2024-07-03 DIAGNOSIS — M7989 Other specified soft tissue disorders: Secondary | ICD-10-CM | POA: Diagnosis not present

## 2024-07-03 DIAGNOSIS — M2012 Hallux valgus (acquired), left foot: Secondary | ICD-10-CM | POA: Diagnosis not present

## 2024-07-03 MED ORDER — PREDNISONE 10 MG PO TABS
30.0000 mg | ORAL_TABLET | Freq: Every day | ORAL | 0 refills | Status: AC
  Filled 2024-07-03: qty 15, 5d supply, fill #0

## 2024-07-03 NOTE — ED Triage Notes (Signed)
 Pt c/o pain to left foot started 1130am today while driving-denies injury-took naproxen 12pm-NAD-limping gait

## 2024-07-03 NOTE — ED Provider Notes (Signed)
 [facility]  Note:  This document was prepared using Conservation officer, historic buildings and may include unintentional dictation errors.  MRN: [id number] DOB: January 22, 1971  Subjective:   Ann Owen is a 53 y.o. female presenting for acute onset of rapidly progressing left foot pain with swelling.  Symptoms started while she was driving.  No fall, trauma.  No history of musculoskeletal disorders.  No history of gout.  Patient would like to have an x-ray done.  No history of DVT.  No calf pain, calf redness or swelling.  No history of clotting disorders.  No current facility-administered medications for this encounter.  Current Outpatient Medications:    amoxicillin  (AMOXIL ) 500 MG capsule, Take 1 capsule (500 mg total) by mouth 4 (four) times daily., Disp: 28 capsule, Rfl: 0   cyclobenzaprine  (FLEXERIL ) 10 MG tablet, Take 1 tablet (10 mg total) by mouth 2 (two) times daily as needed for muscle spasms., Disp: 20 tablet, Rfl: 0   lidocaine  (LIDODERM ) 5 %, Place 1 patch onto the skin daily. Remove & Discard patch within 12 hours or as directed by MD., Disp: 30 patch, Rfl: 0   meloxicam  (MOBIC ) 7.5 MG tablet, Take 7.5 mg by mouth daily., Disp: , Rfl:    Multiple Vitamin (MULTIVITAMIN) tablet, Take 1 tablet by mouth daily., Disp: , Rfl:    No Known Allergies  Past Medical History:  Diagnosis Date   Abnormal perimenopausal bleeding    Dec 29, 2019 STARTED   Anemia    Frequent headaches    TENSION   History of chicken pox AS CHILD     Past Surgical History:  Procedure Laterality Date   DILITATION & CURRETTAGE/HYSTROSCOPY WITH NOVASURE ABLATION N/A 05/24/2020   Procedure: HYSTEROSCOPY WITH NOVASURE ABLATION AND RESECTION OF FIBROID WITH MYOSURE;  Surgeon: Corene Coy, MD;  Location: [facility] [place];  Service: Gynecology;  Laterality: N/A;   NO PAST SURGERIES      Family History  Problem Relation Age of Onset   Hyperlipidemia Mother     Arthritis Father    Cancer Maternal Grandmother        Bone   Colon cancer Neg Hx    Esophageal cancer Neg Hx    Stomach cancer Neg Hx    Rectal cancer Neg Hx     Social History   Tobacco Use   Smoking status: Never   Smokeless tobacco: Never  Vaping Use   Vaping status: Never Used  Substance Use Topics   Alcohol use: No   Drug use: No    ROS   Objective:   Vitals: BP 126/81 (BP Location: Right Arm)   Pulse 69   Temp 98.5 F (36.9 C) (Oral)   Resp 20   SpO2 98%   Physical Exam Constitutional:      General: She is not in acute distress.    Appearance: Normal appearance. She is well-developed. She is not ill-appearing, toxic-appearing or diaphoretic.  HENT:     Head: Normocephalic and atraumatic.     Nose: Nose normal.     Mouth/Throat:     Mouth: Mucous membranes are moist.  Eyes:     General: No scleral icterus.       Right eye: No discharge.        Left eye: No discharge.     Extraocular Movements: Extraocular movements intact.  Cardiovascular:     Rate and Rhythm: Normal rate.  Pulmonary:     Effort: Pulmonary effort is  normal.  Musculoskeletal:       Feet:  Skin:    General: Skin is warm and dry.  Neurological:     General: No focal deficit present.     Mental Status: She is alert and oriented to person, place, and time.  Psychiatric:        Mood and Affect: Mood normal.        Behavior: Behavior normal.    DG Foot Complete Left Result Date: 07/03/2024 EXAM: 3 or more VIEW(S) XRAY OF THE LEFT FOOT 07/03/2024 03:34:31 PM COMPARISON: None available. CLINICAL HISTORY: Left foot pain, swelling. FINDINGS: BONES AND JOINTS: No acute fracture. No focal osseous lesion. No joint dislocation. Mild hallux valgus deformity. SOFT TISSUES: The soft tissues are unremarkable. IMPRESSION: 1. No acute findings. 2. Mild hallux valgus deformity. Electronically signed by: Lonni Necessary MD 07/03/2024 03:42 PM EDT RP Workstation: [id number]        Assessment  and Plan :   PDMP not reviewed this encounter.  1. Left foot pain    Suspect undifferentiated inflammatory process.  Low suspicion for cellulitis, or DVT.  Recommended managing with prednisone  at 30 mg, use RICE method.  Counseled patient on potential for adverse effects with medications prescribed/recommended today, ER and return-to-clinic precautions discussed, patient verbalized understanding.    Christopher Savannah, [place] 07/03/24 1714

## 2024-07-03 NOTE — Discharge Instructions (Addendum)
 You can do icing 20 minutes on, 2 hours off. Use prednisone  for anti-inflammatory properties.

## 2024-07-10 ENCOUNTER — Encounter: Payer: Self-pay | Admitting: Family Medicine

## 2024-08-20 ENCOUNTER — Other Ambulatory Visit (HOSPITAL_BASED_OUTPATIENT_CLINIC_OR_DEPARTMENT_OTHER): Payer: Self-pay

## 2024-08-20 MED ORDER — HYDROCODONE-ACETAMINOPHEN 7.5-325 MG PO TABS
1.0000 | ORAL_TABLET | ORAL | 0 refills | Status: AC
  Filled 2024-08-20: qty 12, 2d supply, fill #0

## 2024-08-20 MED ORDER — PREDNISONE 10 MG (21) PO TBPK
ORAL_TABLET | ORAL | 1 refills | Status: AC
  Filled 2024-08-20: qty 21, 6d supply, fill #0

## 2024-08-31 ENCOUNTER — Other Ambulatory Visit (HOSPITAL_BASED_OUTPATIENT_CLINIC_OR_DEPARTMENT_OTHER): Payer: Self-pay

## 2024-09-03 ENCOUNTER — Other Ambulatory Visit: Payer: Self-pay | Admitting: Medical Genetics

## 2024-09-03 DIAGNOSIS — Z006 Encounter for examination for normal comparison and control in clinical research program: Secondary | ICD-10-CM

## 2024-11-26 ENCOUNTER — Other Ambulatory Visit (HOSPITAL_BASED_OUTPATIENT_CLINIC_OR_DEPARTMENT_OTHER): Payer: Self-pay

## 2024-11-26 MED ORDER — PREDNISONE 10 MG (21) PO TBPK
10.0000 mg | ORAL_TABLET | ORAL | 0 refills | Status: AC
  Filled 2024-11-26: qty 21, 6d supply, fill #0

## 2024-12-11 ENCOUNTER — Other Ambulatory Visit (HOSPITAL_BASED_OUTPATIENT_CLINIC_OR_DEPARTMENT_OTHER): Payer: Self-pay

## 2024-12-11 MED ORDER — HYDROCODONE-ACETAMINOPHEN 7.5-325 MG PO TABS
1.0000 | ORAL_TABLET | ORAL | 0 refills | Status: AC | PRN
  Filled 2024-12-11: qty 12, 2d supply, fill #0
